# Patient Record
Sex: Female | Born: 1985 | Race: Asian | Hispanic: No | Marital: Married | Smoking: Never smoker
Health system: Southern US, Community
[De-identification: ages and names within clinical notes are randomized; demographics above are authoritative.]

## PROBLEM LIST (undated history)

## (undated) ENCOUNTER — Inpatient Hospital Stay (HOSPITAL_COMMUNITY): Payer: BLUE CROSS/BLUE SHIELD

## (undated) DIAGNOSIS — Z789 Other specified health status: Secondary | ICD-10-CM

## (undated) DIAGNOSIS — G5600 Carpal tunnel syndrome, unspecified upper limb: Secondary | ICD-10-CM

## (undated) HISTORY — PX: NO PAST SURGERIES: SHX2092

---

## 2005-04-22 ENCOUNTER — Other Ambulatory Visit: Admission: RE | Admit: 2005-04-22 | Discharge: 2005-04-22 | Payer: Self-pay | Admitting: Internal Medicine

## 2006-04-28 ENCOUNTER — Other Ambulatory Visit: Admission: RE | Admit: 2006-04-28 | Discharge: 2006-04-28 | Payer: Self-pay | Admitting: Internal Medicine

## 2007-06-18 ENCOUNTER — Other Ambulatory Visit: Admission: RE | Admit: 2007-06-18 | Discharge: 2007-06-18 | Payer: Self-pay | Admitting: Internal Medicine

## 2008-07-19 ENCOUNTER — Other Ambulatory Visit: Admission: RE | Admit: 2008-07-19 | Discharge: 2008-07-19 | Payer: Self-pay | Admitting: Internal Medicine

## 2008-08-08 ENCOUNTER — Emergency Department (HOSPITAL_COMMUNITY): Admission: EM | Admit: 2008-08-08 | Discharge: 2008-08-08 | Payer: Self-pay | Admitting: Emergency Medicine

## 2008-08-10 ENCOUNTER — Encounter: Admission: RE | Admit: 2008-08-10 | Discharge: 2008-08-10 | Payer: Self-pay | Admitting: Internal Medicine

## 2009-10-09 ENCOUNTER — Other Ambulatory Visit: Admission: RE | Admit: 2009-10-09 | Discharge: 2009-10-09 | Payer: Self-pay | Admitting: Internal Medicine

## 2010-05-20 LAB — DIFFERENTIAL
Basophils Relative: 0 % (ref 0–1)
Eosinophils Absolute: 0.1 10*3/uL (ref 0.0–0.7)
Eosinophils Relative: 1 % (ref 0–5)
Monocytes Relative: 6 % (ref 3–12)
Neutrophils Relative %: 78 % — ABNORMAL HIGH (ref 43–77)

## 2010-05-20 LAB — URINE CULTURE: Colony Count: 85000

## 2010-05-20 LAB — CBC
Hemoglobin: 13.2 g/dL (ref 12.0–15.0)
RBC: 4.62 MIL/uL (ref 3.87–5.11)
WBC: 16.7 10*3/uL — ABNORMAL HIGH (ref 4.0–10.5)

## 2010-05-20 LAB — URINALYSIS, ROUTINE W REFLEX MICROSCOPIC
Bilirubin Urine: NEGATIVE
Glucose, UA: NEGATIVE mg/dL
Ketones, ur: 15 mg/dL — AB
Leukocytes, UA: NEGATIVE
Protein, ur: 30 mg/dL — AB

## 2010-05-20 LAB — COMPREHENSIVE METABOLIC PANEL
ALT: 17 U/L (ref 0–35)
AST: 29 U/L (ref 0–37)
Alkaline Phosphatase: 53 U/L (ref 39–117)
CO2: 26 mEq/L (ref 19–32)
GFR calc non Af Amer: >60 mL/min (ref >60)
Glucose, Bld: 137 mg/dL — ABNORMAL HIGH (ref 70–99)
Potassium: 3.3 mEq/L — ABNORMAL LOW (ref 3.5–5.1)
Sodium: 137 mEq/L (ref 135–145)

## 2010-05-20 LAB — URINE MICROSCOPIC-ADD ON

## 2010-05-20 LAB — LIPASE, BLOOD: Lipase: 17 U/L (ref 11–59)

## 2010-11-25 ENCOUNTER — Other Ambulatory Visit (HOSPITAL_COMMUNITY)
Admission: RE | Admit: 2010-11-25 | Discharge: 2010-11-25 | Disposition: A | Discharge status: Home / Self Care | Payer: BC Managed Care – PPO | Source: Ambulatory Visit | Attending: Internal Medicine | Admitting: Internal Medicine

## 2010-11-25 DIAGNOSIS — Z01419 Encounter for gynecological examination (general) (routine) without abnormal findings: Secondary | ICD-10-CM | POA: Insufficient documentation

## 2011-02-17 ENCOUNTER — Other Ambulatory Visit (HOSPITAL_COMMUNITY)
Admission: RE | Admit: 2011-02-17 | Discharge: 2011-02-17 | Disposition: A | Discharge status: Home / Self Care | Payer: BC Managed Care – PPO | Source: Ambulatory Visit | Attending: Internal Medicine | Admitting: Internal Medicine

## 2011-02-17 DIAGNOSIS — Z01419 Encounter for gynecological examination (general) (routine) without abnormal findings: Secondary | ICD-10-CM | POA: Insufficient documentation

## 2011-10-17 ENCOUNTER — Emergency Department (INDEPENDENT_AMBULATORY_CARE_PROVIDER_SITE_OTHER)
Admission: EM | Admit: 2011-10-17 | Discharge: 2011-10-17 | Disposition: A | Discharge status: Home / Self Care | Payer: BC Managed Care – PPO | Source: Home / Self Care | Attending: Emergency Medicine | Admitting: Emergency Medicine

## 2011-10-17 ENCOUNTER — Encounter (HOSPITAL_COMMUNITY): Payer: Self-pay | Admitting: Emergency Medicine

## 2011-10-17 DIAGNOSIS — S46019A Strain of muscle(s) and tendon(s) of the rotator cuff of unspecified shoulder, initial encounter: Secondary | ICD-10-CM

## 2011-10-17 DIAGNOSIS — S139XXA Sprain of joints and ligaments of unspecified parts of neck, initial encounter: Secondary | ICD-10-CM

## 2011-10-17 DIAGNOSIS — S161XXA Strain of muscle, fascia and tendon at neck level, initial encounter: Secondary | ICD-10-CM

## 2011-10-17 MED ORDER — TRAMADOL HCL 50 MG PO TABS: 100.0000 mg | ORAL_TABLET | Freq: Three times a day (TID) | ORAL | Status: AC | PRN

## 2011-10-17 MED ORDER — METHOCARBAMOL 500 MG PO TABS: 500.0000 mg | ORAL_TABLET | Freq: Three times a day (TID) | ORAL | Status: AC

## 2011-10-17 MED ORDER — MELOXICAM 15 MG PO TABS: 15.0000 mg | ORAL_TABLET | Freq: Every day | ORAL | Status: DC
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
## 2011-12-31 LAB — OB RESULTS CONSOLE ABO/RH

## 2011-12-31 LAB — OB RESULTS CONSOLE RUBELLA ANTIBODY, IGM: Rubella: IMMUNE

## 2011-12-31 LAB — OB RESULTS CONSOLE HIV ANTIBODY (ROUTINE TESTING): HIV: NONREACTIVE

## 2012-01-06 LAB — OB RESULTS CONSOLE GC/CHLAMYDIA
Chlamydia: NEGATIVE
Gonorrhea: NEGATIVE

## 2012-02-11 NOTE — L&D Delivery Note (Signed)
Delivery Note At 11:26 PM a viable and healthy female was delivered via Vaginal, Spontaneous Delivery (Presentation:ROA ;  ).  APGAR: 8, 9; weight . 6lb 2 oz Placenta status: , Spontaneous. Intact to Path Pathology.  Cord: 3 vessels with the following complications: None.  Cord pH: none  Anesthesia: Epidural  Episiotomy:none  Lacerations: none Suture Repair: none Est. Blood Loss (mL): 250cc  Mom to postpartum.  Baby to nursery-stable.  Singleton Cristina Smith A 07/24/2012, 11:41 PM

## 2012-05-14 LAB — OB RESULTS CONSOLE RPR: RPR: NONREACTIVE

## 2012-07-16 LAB — OB RESULTS CONSOLE GBS: GBS: NEGATIVE

## 2012-07-24 ENCOUNTER — Inpatient Hospital Stay (HOSPITAL_COMMUNITY): Payer: BC Managed Care – PPO | Admitting: Anesthesiology

## 2012-07-24 ENCOUNTER — Inpatient Hospital Stay (HOSPITAL_COMMUNITY)
Admission: AD | Admit: 2012-07-24 | Discharge: 2012-07-26 | DRG: 372 | Disposition: A | Discharge status: Home / Self Care | Payer: BC Managed Care – PPO | Source: Ambulatory Visit | Attending: Obstetrics and Gynecology | Admitting: Obstetrics and Gynecology

## 2012-07-24 ENCOUNTER — Encounter (HOSPITAL_COMMUNITY): Payer: Self-pay | Admitting: Anesthesiology

## 2012-07-24 ENCOUNTER — Encounter (HOSPITAL_COMMUNITY): Payer: Self-pay | Admitting: *Deleted

## 2012-07-24 DIAGNOSIS — O139 Gestational [pregnancy-induced] hypertension without significant proteinuria, unspecified trimester: Secondary | ICD-10-CM | POA: Diagnosis present

## 2012-07-24 DIAGNOSIS — O4290 Premature rupture of membranes, unspecified as to length of time between rupture and onset of labor, unspecified weeks of gestation: Principal | ICD-10-CM | POA: Diagnosis present

## 2012-07-24 DIAGNOSIS — O133 Gestational [pregnancy-induced] hypertension without significant proteinuria, third trimester: Secondary | ICD-10-CM

## 2012-07-24 HISTORY — DX: Other specified health status: Z78.9

## 2012-07-24 LAB — ABO/RH: ABO/RH(D): O POS

## 2012-07-24 LAB — CBC
HCT: 38.1 % (ref 36.0–46.0)
Hemoglobin: 12.4 g/dL (ref 12.0–15.0)
MCH: 27 pg (ref 26.0–34.0)
MCHC: 33.3 g/dL (ref 30.0–36.0)
MCV: 81 fL (ref 78.0–100.0)
Platelets: 243 10*3/uL (ref 150–400)
RBC: 4.67 MIL/uL (ref 3.87–5.11)
RBC: 4.59 MIL/uL (ref 3.87–5.11)
WBC: 10.5 10*3/uL (ref 4.0–10.5)

## 2012-07-24 LAB — TYPE AND SCREEN
ABO/RH(D): O POS
Antibody Screen: NEGATIVE

## 2012-07-24 LAB — RPR: RPR Ser Ql: NONREACTIVE

## 2012-07-24 MED ORDER — ONDANSETRON HCL 4 MG/2ML IJ SOLN: 4.0000 mg | Freq: Four times a day (QID) | INTRAMUSCULAR | Status: DC | PRN

## 2012-07-24 MED ORDER — LACTATED RINGERS IV SOLN: 500.0000 mL | INTRAVENOUS | Status: DC | PRN

## 2012-07-24 MED ORDER — LACTATED RINGERS IV SOLN
INTRAVENOUS | Status: DC
  Administered 2012-07-24: 19:00:00 via INTRAVENOUS
  Administered 2012-07-24: 125 mL/h via INTRAVENOUS

## 2012-07-24 MED ORDER — FLEET ENEMA 7-19 GM/118ML RE ENEM: 1.0000 | ENEMA | RECTAL | Status: DC | PRN

## 2012-07-24 MED ORDER — OXYTOCIN 40 UNITS IN LACTATED RINGERS INFUSION - SIMPLE MED
1.0000 m[IU]/min | INTRAVENOUS | Status: DC
  Administered 2012-07-24: 2 m[IU]/min via INTRAVENOUS
  Administered 2012-07-24: 4 m[IU]/min via INTRAVENOUS
  Filled 2012-07-24: qty 1000

## 2012-07-24 MED ORDER — OXYTOCIN BOLUS FROM INFUSION
500.0000 mL | INTRAVENOUS | Status: DC
  Administered 2012-07-24: 500 mL via INTRAVENOUS

## 2012-07-24 MED ORDER — OXYCODONE-ACETAMINOPHEN 5-325 MG PO TABS: 1.0000 | ORAL_TABLET | ORAL | Status: DC | PRN

## 2012-07-24 MED ORDER — LIDOCAINE HCL (PF) 1 % IJ SOLN
INTRAMUSCULAR | Status: DC | PRN
  Administered 2012-07-24 (×2): 4 mL
  Administered 2012-07-24: 2 mL
  Administered 2012-07-24: 4 mL

## 2012-07-24 MED ORDER — LACTATED RINGERS IV SOLN
500.0000 mL | Freq: Once | INTRAVENOUS | Status: AC
  Administered 2012-07-24: 500 mL via INTRAVENOUS

## 2012-07-24 MED ORDER — CITRIC ACID-SODIUM CITRATE 334-500 MG/5ML PO SOLN: 30.0000 mL | ORAL | Status: DC | PRN

## 2012-07-24 MED ORDER — OXYTOCIN 40 UNITS IN LACTATED RINGERS INFUSION - SIMPLE MED: 62.5000 mL/h | INTRAVENOUS | Status: DC

## 2012-07-24 MED ORDER — TERBUTALINE SULFATE 1 MG/ML IJ SOLN: 0.2500 mg | Freq: Once | INTRAMUSCULAR | Status: AC | PRN

## 2012-07-24 MED ORDER — LIDOCAINE HCL (PF) 1 % IJ SOLN
30.0000 mL | INTRAMUSCULAR | Status: DC | PRN
  Filled 2012-07-24: qty 30

## 2012-07-24 MED ORDER — DIPHENHYDRAMINE HCL 50 MG/ML IJ SOLN: 12.5000 mg | INTRAMUSCULAR | Status: DC | PRN

## 2012-07-24 MED ORDER — EPHEDRINE 5 MG/ML INJ
10.0000 mg | INTRAVENOUS | Status: DC | PRN
  Filled 2012-07-24: qty 2

## 2012-07-24 MED ORDER — BUPIVACAINE HCL (PF) 0.25 % IJ SOLN
INTRAMUSCULAR | Status: DC | PRN
  Administered 2012-07-24: 5 mL via EPIDURAL

## 2012-07-24 MED ORDER — PHENYLEPHRINE 40 MCG/ML (10ML) SYRINGE FOR IV PUSH (FOR BLOOD PRESSURE SUPPORT)
80.0000 ug | PREFILLED_SYRINGE | INTRAVENOUS | Status: DC | PRN
  Filled 2012-07-24: qty 5
  Filled 2012-07-24: qty 2

## 2012-07-24 MED ORDER — EPHEDRINE 5 MG/ML INJ
10.0000 mg | INTRAVENOUS | Status: DC | PRN
  Filled 2012-07-24: qty 2
  Filled 2012-07-24: qty 4

## 2012-07-24 MED ORDER — PHENYLEPHRINE 40 MCG/ML (10ML) SYRINGE FOR IV PUSH (FOR BLOOD PRESSURE SUPPORT)
80.0000 ug | PREFILLED_SYRINGE | INTRAVENOUS | Status: DC | PRN
  Filled 2012-07-24: qty 2

## 2012-07-24 MED ORDER — NALBUPHINE SYRINGE 5 MG/0.5 ML
10.0000 mg | INJECTION | INTRAMUSCULAR | Status: DC | PRN
  Administered 2012-07-24: 10 mg via INTRAVENOUS
  Filled 2012-07-24 (×2): qty 1

## 2012-07-24 MED ORDER — FENTANYL 2.5 MCG/ML BUPIVACAINE 1/10 % EPIDURAL INFUSION (WH - ANES)
14.0000 mL/h | INTRAMUSCULAR | Status: DC | PRN
  Administered 2012-07-24: 12 mL/h via EPIDURAL
  Filled 2012-07-24: qty 125

## 2012-07-24 MED ORDER — ACETAMINOPHEN 325 MG PO TABS
650.0000 mg | ORAL_TABLET | ORAL | Status: DC | PRN
  Administered 2012-07-24: 650 mg via ORAL
  Filled 2012-07-24: qty 2

## 2012-07-24 MED ORDER — IBUPROFEN 600 MG PO TABS: 600.0000 mg | ORAL_TABLET | Freq: Four times a day (QID) | ORAL | Status: DC | PRN

## 2012-07-24 NOTE — H&P (Signed)
Cristina Smith is a 27 y.o. female presenting for admission due to SROM clear fluid @ 36 2/7 weeks. (-) GBS. Pt had normal PIH labs done in office yesterday. Denies PIH warning signs (+) LE edema Maternal Medical History:  Reason for admission: Rupture of membranes.   Contractions: Frequency: irregular.    Fetal activity: Perceived fetal activity is normal.    Prenatal complications: no prenatal complications Prenatal Complications - Diabetes: none.    OB History   Grav Para Term Preterm Abortions TAB SAB Ect Mult Living   1              Past Medical History  Diagnosis Date  . Medical history non-contributory    Past Surgical History  Procedure Laterality Date  . No past surgeries     Family History: family history includes Cancer in her father and Hypertension in her mother. Social History:  reports that she has never smoked. She has never used smokeless tobacco. She reports that she does not drink alcohol or use illicit drugs.   Prenatal Transfer Tool  Maternal Diabetes: No Genetic Screening: Normal Maternal Ultrasounds/Referrals: Normal Fetal Ultrasounds or other Referrals:  None Maternal Substance Abuse:  No Significant Maternal Medications:  None Significant Maternal Lab Results:  Lab values include: Group B Strep negative Other Comments:  None  Review of Systems  Eyes: Negative for blurred vision.  Gastrointestinal: Negative for heartburn.  Neurological: Negative for headaches.    Dilation: 2 Effacement (%): 80 Station: -2;-1 Exam by:: K.WIlson,RN Blood pressure 134/94, pulse 94, temperature 98.4 F (36.9 C), temperature source Oral, resp. rate 20, height 5' (1.524 m), weight 85.276 kg (188 lb), last menstrual period 11/13/2011. Exam Physical Exam  Constitutional: She is oriented to person, place, and time. She appears well-developed and well-nourished.  Eyes: EOM are normal.  Neck: Neck supple.  Cardiovascular: Normal rate and regular rhythm.    Respiratory: Breath sounds normal.  GI: Soft.  Musculoskeletal: She exhibits edema.  2(+)  Neurological: She is alert and oriented to person, place, and time.  Skin: Skin is warm and dry.  Psychiatric: She has a normal mood and affect.   VE 1/60/-3 vtx Prenatal labs: ABO, Rh: O/Positive/-- (11/20 0000) Antibody: Negative (11/20 0000) Rubella: Immune (11/20 0000) RPR: Nonreactive (04/04 0000)  HBsAg: Negative (11/20 0000)  HIV: Non-reactive (11/20 0000)  GBS:   negative  Assessment/Plan: PPROM @ 36 2/7 weeks P) admit routine labs pitocin augmentation, watch BP. Analgesic prn  Erza Mothershead A 07/24/2012, 2:09 PM

## 2012-07-24 NOTE — MAU Note (Signed)
Pt presents with contractions that started last night and states they are getting closer. She noticed a large gush of fluid with some vaginal bleeding at 1130. States baby is active.

## 2012-07-24 NOTE — Progress Notes (Signed)
Up to BR to void.  Tolerated activity well. 

## 2012-07-24 NOTE — Anesthesia Preprocedure Evaluation (Signed)

## 2012-07-24 NOTE — Progress Notes (Signed)
Cristina Smith is a 28 y.o. G1P0 at [redacted]w[redacted]d by ultrasound admitted for rupture of membranes  Subjective: Chief Complaint  Patient presents with  . Contractions  . possible rupture of membranes   c/o painful ctx  Pitocin 6 MIU Objective: BP 133/89  Pulse 99  Temp(Src) 97.9 F (36.6 C) (Oral)  Resp 22  Ht 5' (1.524 m)  Wt 85.276 kg (188 lb)  BMI 36.72 kg/m2  LMP 11/13/2011      FHT:  FHR: 140 bpm, variability: moderate,  accelerations:  Present,  decelerations:  Absent UC:   irregular, every 2-13minutes SVE:   3/100/-1 per RN  Labs: Lab Results  Component Value Date   WBC 10.5 07/24/2012   HGB 12.4 07/24/2012   HCT 38.1 07/24/2012   MCV 81.6 07/24/2012   PLT 275 07/24/2012    Assessment / Plan: Induction of labor due to PROM,  progressing well on pitocin P) epidural prn. Cont pitocin  Anticipated MOD:  NSVD  Lathon Adan A 07/24/2012, 7:09 PM

## 2012-07-24 NOTE — Anesthesia Procedure Notes (Signed)
Epidural Patient location during procedure: OB Start time: 07/24/2012 7:41 PM  Staffing Performed by: anesthesiologist   Preanesthetic Checklist Completed: patient identified, site marked, surgical consent, pre-op evaluation, timeout performed, IV checked, risks and benefits discussed and monitors and equipment checked  Epidural Patient position: sitting Prep: site prepped and draped and DuraPrep Patient monitoring: continuous pulse ox and blood pressure Approach: midline Injection technique: LOR air  Needle:  Needle type: Tuohy  Needle gauge: 17 G Needle length: 9 cm and 9 Needle insertion depth: 5.5 cm Catheter type: closed end flexible Catheter size: 19 Gauge Catheter at skin depth: 10.5 cm Test dose: negative  Assessment Events: blood not aspirated, injection not painful, no injection resistance, negative IV test and no paresthesia  Additional Notes Discussed risk of headache, infection, bleeding, nerve injury and failed or incomplete block.  Patient voices understanding and wishes to proceed.  Epidural placed easily on first attempt.  No paresthesia.  Patient tolerated procedure well with no apparent complications.  Jasmine December, MD Reason for block:procedure for pain

## 2012-07-24 NOTE — Progress Notes (Signed)
Dr. Cherly Hensen reports GBS neg.

## 2012-07-24 NOTE — Progress Notes (Signed)
S: no complaint. Pushing well  O: Pitocin Epidural  VE: fully dilated (+2 station  Tracing; cat 1 Ctx q  IMP: PPROM GBS cx neg P) anticipate SVD

## 2012-07-25 ENCOUNTER — Encounter (HOSPITAL_COMMUNITY): Payer: Self-pay | Admitting: *Deleted

## 2012-07-25 DIAGNOSIS — O139 Gestational [pregnancy-induced] hypertension without significant proteinuria, unspecified trimester: Secondary | ICD-10-CM | POA: Diagnosis present

## 2012-07-25 DIAGNOSIS — O4290 Premature rupture of membranes, unspecified as to length of time between rupture and onset of labor, unspecified weeks of gestation: Secondary | ICD-10-CM | POA: Diagnosis present

## 2012-07-25 LAB — CBC
Platelets: 221 10*3/uL (ref 150–400)
RBC: 4.31 MIL/uL (ref 3.87–5.11)
WBC: 22.5 10*3/uL — ABNORMAL HIGH (ref 4.0–10.5)

## 2012-07-25 LAB — COMPREHENSIVE METABOLIC PANEL
ALT: 21 U/L (ref 0–35)
AST: 26 U/L (ref 0–37)
Alkaline Phosphatase: 122 U/L — ABNORMAL HIGH (ref 39–117)
CO2: 21 mEq/L (ref 19–32)
Chloride: 101 mEq/L (ref 96–112)
GFR calc non Af Amer: >90 mL/min (ref >90)
Sodium: 133 mEq/L — ABNORMAL LOW (ref 135–145)
Total Bilirubin: 0.3 mg/dL (ref 0.3–1.2)

## 2012-07-25 MED ORDER — LANOLIN HYDROUS EX OINT: TOPICAL_OINTMENT | CUTANEOUS | Status: DC | PRN

## 2012-07-25 MED ORDER — OXYCODONE-ACETAMINOPHEN 5-325 MG PO TABS: 1.0000 | ORAL_TABLET | ORAL | Status: DC | PRN

## 2012-07-25 MED ORDER — HYDROCHLOROTHIAZIDE 12.5 MG PO CAPS
12.5000 mg | ORAL_CAPSULE | Freq: Every day | ORAL | Status: DC
  Administered 2012-07-25 – 2012-07-26 (×2): 12.5 mg via ORAL
  Filled 2012-07-25 (×3): qty 1

## 2012-07-25 MED ORDER — FERROUS SULFATE 325 (65 FE) MG PO TABS
325.0000 mg | ORAL_TABLET | Freq: Two times a day (BID) | ORAL | Status: DC
  Administered 2012-07-25 – 2012-07-26 (×3): 325 mg via ORAL
  Filled 2012-07-25 (×4): qty 1

## 2012-07-25 MED ORDER — POTASSIUM CHLORIDE 20 MEQ/15ML (10%) PO LIQD
40.0000 meq | Freq: Two times a day (BID) | ORAL | Status: DC
  Administered 2012-07-25 – 2012-07-26 (×4): 40 meq via ORAL
  Filled 2012-07-25 (×6): qty 30

## 2012-07-25 MED ORDER — DIPHENHYDRAMINE HCL 25 MG PO CAPS: 25.0000 mg | ORAL_CAPSULE | Freq: Four times a day (QID) | ORAL | Status: DC | PRN

## 2012-07-25 MED ORDER — POTASSIUM CHLORIDE 20 MEQ PO PACK
40.0000 meq | PACK | Freq: Two times a day (BID) | ORAL | Status: DC
  Filled 2012-07-25 (×2): qty 2

## 2012-07-25 MED ORDER — BENZOCAINE-MENTHOL 20-0.5 % EX AERO
1.0000 "application " | INHALATION_SPRAY | CUTANEOUS | Status: DC | PRN
  Administered 2012-07-25: 1 via TOPICAL
  Filled 2012-07-25: qty 56

## 2012-07-25 MED ORDER — SIMETHICONE 80 MG PO CHEW: 80.0000 mg | CHEWABLE_TABLET | ORAL | Status: DC | PRN

## 2012-07-25 MED ORDER — IBUPROFEN 600 MG PO TABS
600.0000 mg | ORAL_TABLET | Freq: Four times a day (QID) | ORAL | Status: DC
  Administered 2012-07-25 – 2012-07-26 (×7): 600 mg via ORAL
  Filled 2012-07-25 (×8): qty 1

## 2012-07-25 MED ORDER — LORATADINE 10 MG PO TABS
10.0000 mg | ORAL_TABLET | Freq: Every day | ORAL | Status: DC
  Administered 2012-07-25 – 2012-07-26 (×2): 10 mg via ORAL
  Filled 2012-07-25 (×3): qty 1

## 2012-07-25 MED ORDER — WITCH HAZEL-GLYCERIN EX PADS: 1.0000 "application " | MEDICATED_PAD | CUTANEOUS | Status: DC | PRN

## 2012-07-25 MED ORDER — PRENATAL MULTIVITAMIN CH
1.0000 | ORAL_TABLET | Freq: Every day | ORAL | Status: DC
  Administered 2012-07-25 – 2012-07-26 (×2): 1 via ORAL
  Filled 2012-07-25: qty 1

## 2012-07-25 MED ORDER — TETANUS-DIPHTH-ACELL PERTUSSIS 5-2.5-18.5 LF-MCG/0.5 IM SUSP: 0.5000 mL | Freq: Once | INTRAMUSCULAR | Status: DC

## 2012-07-25 MED ORDER — DIBUCAINE 1 % RE OINT: 1.0000 "application " | TOPICAL_OINTMENT | RECTAL | Status: DC | PRN

## 2012-07-25 MED ORDER — ZOLPIDEM TARTRATE 5 MG PO TABS: 5.0000 mg | ORAL_TABLET | Freq: Every evening | ORAL | Status: DC | PRN

## 2012-07-25 MED ORDER — ONDANSETRON HCL 4 MG/2ML IJ SOLN: 4.0000 mg | INTRAMUSCULAR | Status: DC | PRN

## 2012-07-25 MED ORDER — ONDANSETRON HCL 4 MG PO TABS: 4.0000 mg | ORAL_TABLET | ORAL | Status: DC | PRN

## 2012-07-25 MED ORDER — SENNOSIDES-DOCUSATE SODIUM 8.6-50 MG PO TABS
2.0000 | ORAL_TABLET | Freq: Every day | ORAL | Status: DC
  Administered 2012-07-25: 2 via ORAL

## 2012-07-25 NOTE — Progress Notes (Signed)
Patient ID: Nakaiya Beddow, female   DOB: 1985-09-05, 27 y.o.   MRN: [id number] PPD # 1  Subjective: Pt reports feeling well.  Denies HA, visual changes, CP, SOB Pain controlled with ibuprofen Tolerating po/ Voiding without problems/ No n/v Bleeding is moderate Newborn info:  Information for the patient's newborn:  Morningstar, Toft [[id number]]  female  / circ not planned/ Feeding: breast   Objective:  VS: Blood pressure 121/85, pulse 93, temperature 98.1 F (36.7 C), temperature source Oral, resp. rate 18  Recent Labs  07/24/12 1855 07/25/12 0609  WBC 15.0* 22.5*  HGB 12.4 11.6*  HCT 37.2 34.8*  PLT 243 221    Blood type: O POS (06/14 1310) Rubella: Immune (11/20 0000)    Physical Exam:  General:  alert, cooperative and no distress CV: Regular rate and rhythm Resp: clear Abdomen: soft, nontender, normal bowel sounds Uterine Fundus: firm, below umbilicus, nontender Perineum: intact; mild edema Lochia: moderate Ext: edema +1 to 2 pedal/+1 pretib and Homans sign is negative, no sign of DVT   A/P: PPD # 1/ G1P0101/ S/P: SVD Hx gestational HTN with fluid retention; HCTZ started, watch BP's; Labs WNL this am Doing well Continue routine post partum orders Anticipate D/C home in AM    Demetrius Revel, MSN, [facility] 07/25/2012, 9:56 AM

## 2012-07-25 NOTE — Anesthesia Postprocedure Evaluation (Signed)
Anesthesia Post Note  Patient: Cristina Smith  Procedure(s) Performed: * No procedures listed *  Anesthesia type: Epidural  Patient location: Mother/Baby  Post pain: Pain level controlled  Post assessment: Post-op Vital signs reviewed  Last Vitals:  Filed Vitals:   07/25/12 0654  BP: 121/85  Pulse: 93  Temp: 36.7 C  Resp: 18    Post vital signs: Reviewed  Level of consciousness:alert  Complications: No apparent anesthesia complications

## 2012-07-26 LAB — CBC
HCT: 34.7 % — ABNORMAL LOW (ref 36.0–46.0)
Hemoglobin: 11.2 g/dL — ABNORMAL LOW (ref 12.0–15.0)
MCH: 26.7 pg (ref 26.0–34.0)
MCHC: 32.3 g/dL (ref 30.0–36.0)
MCV: 82.8 fL (ref 78.0–100.0)
Platelets: 242 10*3/uL (ref 150–400)
RBC: 4.19 MIL/uL (ref 3.87–5.11)
RDW: 16.7 % — ABNORMAL HIGH (ref 11.5–15.5)
WBC: 18.8 10*3/uL — ABNORMAL HIGH (ref 4.0–10.5)

## 2012-07-26 MED ORDER — IBUPROFEN 600 MG PO TABS: 600.0000 mg | ORAL_TABLET | Freq: Four times a day (QID) | ORAL | Status: DC | PRN

## 2012-07-26 MED ORDER — DOCUSATE SODIUM 100 MG PO CAPS: 100.0000 mg | ORAL_CAPSULE | Freq: Two times a day (BID) | ORAL | Status: DC | PRN

## 2012-07-26 MED ORDER — OXYCODONE-ACETAMINOPHEN 5-325 MG PO TABS: 1.0000 | ORAL_TABLET | Freq: Four times a day (QID) | ORAL | Status: DC | PRN

## 2012-07-26 MED ORDER — HYDROCHLOROTHIAZIDE 12.5 MG PO CAPS: 12.5000 mg | ORAL_CAPSULE | Freq: Every day | ORAL | Status: DC

## 2012-07-26 NOTE — Discharge Summary (Addendum)
Obstetric Discharge Summary 27 year old G1P0 admitted for SROM at [redacted]w[redacted]d.  Reason for Admission: onset of labor and rupture of membranes Prenatal Procedures: none Intrapartum Procedures: spontaneous vaginal delivery Postpartum Procedures: none Complications-Operative and Postpartum: none Hemoglobin  Date Value Range Status  07/25/2012 11.6* 12.0 - 15.0 g/dL Final     HCT  Date Value Range Status  07/25/2012 34.8* 36.0 - 46.0 % Final    Physical Exam:  General: alert, cooperative and no distress Lochia: appropriate Uterine Fundus: firm DVT Evaluation: No evidence of DVT seen on physical exam. Negative Homan's sign.  Discharge Diagnoses: G1 P1 S/P PTD @ 36wks  Discharge Information: Date: 07/26/2012 Activity: pelvic rest Diet: routine Medications: Ibuprofen and Colace and Microzide 12.5 mg Condition: stable Instructions: refer to practice specific booklet Discharge to: home Follow-up Information   Follow up with MODY,VAISHALI R, MD In 6 weeks.   Contact information:   [address] [place] [address] [phone]       Newborn Data: Live born female  Birth Weight: 6 lb 2.2 oz (2785 g) APGAR: 8, 9  Home with mother.  Leward Quan, RN, FNP Student 07/26/2012, 9:48 AM  Reviewed by: Arlana Lindau, MSN, [facility] 07/26/12 10:02

## 2012-07-26 NOTE — Progress Notes (Addendum)
PPD # 2 S/P SVD with no repair. Subjective: Pt reports feeling well but fatigued after little sleep last night/ Pain controlled with prescription NSAID's including motrin Tolerating po/ Voiding without problems/ No n/v Bleeding is light/ Newborn info:  Information for the patient's newborn:  Mardie, Kellen [[id number]]  female circ not planned/ Feeding: breast and bottle    Objective:  VS: Blood pressure 126/82, pulse 89, temperature 98.5 F (36.9 C), temperature source Oral, resp. rate 18, height 5' (1.524 m), weight 188 lb (85.276 kg), last menstrual period 10/25/2011, unknown if currently breastfeeding.    Recent Labs  07/24/12 1855 07/25/12 0609  WBC 15.0* 22.5*  HGB 12.4 11.6*  HCT 37.2 34.8*  PLT 243 221   Mild leukocytosis. No fevers.  Blood type: --/--/O POS, O POS (06/14 1310) Rubella: Immune (11/20 0000)    Physical Exam:  General: A & O x 3  alert, cooperative and no distress CV: Regular rate and rhythm Resp: clear Abdomen: soft, nontender, normal bowel sounds Uterine Fundus: firm, below umbilicus, nontender Lochia: minimal Ext: edema 2+ pedal and Homans sign is negative, no sign of DVT    A/P: PPD # 2/ G1P0101/ S/P:spontaneous vaginal Hx Gest HTN with fluid retention since delivery; stable and will cont HCTZ to d 5 pp Mild leukocytosis- will recheck CBC this AM and make sure is trending down. Afebrile. Doing well and stable for discharge home RX: Ibuprofen 600mg  po Q 6 hrs prn pain #30 Refill x 1 HCTZ 12.5mg  po QD x 3 Percocet 5/325 1 to 2 po Q 4 hrs prn pain #15 No refill Colace 100 mg TID PRN #30, Refill x1 WOB/GYN booklet given Routine pp visit in 6wks   TEDDER, Elisha Headland, RN, BSN, FNP Student  07/26/2012, 9:42 AM

## 2013-08-01 ENCOUNTER — Emergency Department (HOSPITAL_COMMUNITY)
Admission: EM | Admit: 2013-08-01 | Discharge: 2013-08-01 | Disposition: A | Discharge status: Home / Self Care | Payer: BC Managed Care – PPO | Attending: Emergency Medicine | Admitting: Emergency Medicine

## 2013-08-01 ENCOUNTER — Encounter (HOSPITAL_COMMUNITY): Payer: Self-pay | Admitting: Emergency Medicine

## 2013-08-01 ENCOUNTER — Emergency Department (HOSPITAL_COMMUNITY): Payer: BC Managed Care – PPO

## 2013-08-01 DIAGNOSIS — R1013 Epigastric pain: Secondary | ICD-10-CM

## 2013-08-01 DIAGNOSIS — Z3202 Encounter for pregnancy test, result negative: Secondary | ICD-10-CM | POA: Insufficient documentation

## 2013-08-01 DIAGNOSIS — R112 Nausea with vomiting, unspecified: Secondary | ICD-10-CM

## 2013-08-01 DIAGNOSIS — Z791 Long term (current) use of non-steroidal anti-inflammatories (NSAID): Secondary | ICD-10-CM | POA: Insufficient documentation

## 2013-08-01 DIAGNOSIS — Z79899 Other long term (current) drug therapy: Secondary | ICD-10-CM | POA: Insufficient documentation

## 2013-08-01 LAB — URINALYSIS, ROUTINE W REFLEX MICROSCOPIC
BILIRUBIN URINE: NEGATIVE
GLUCOSE, UA: NEGATIVE mg/dL
KETONES UR: NEGATIVE mg/dL
Nitrite: NEGATIVE
PH: 7.5 (ref 5.0–8.0)
PROTEIN: 100 mg/dL — AB
Specific Gravity, Urine: 1.026 (ref 1.005–1.030)
Urobilinogen, UA: 0.2 mg/dL (ref 0.0–1.0)

## 2013-08-01 LAB — CBC WITH DIFFERENTIAL/PLATELET
BASOS ABS: 0 10*3/uL (ref 0.0–0.1)
Basophils Relative: 0 % (ref 0–1)
EOS PCT: 0 % (ref 0–5)
Eosinophils Absolute: 0 10*3/uL (ref 0.0–0.7)
HEMATOCRIT: 37.8 % (ref 36.0–46.0)
Hemoglobin: 12.1 g/dL (ref 12.0–15.0)
LYMPHS ABS: 1.6 10*3/uL (ref 0.7–4.0)
LYMPHS PCT: 17 % (ref 12–46)
MCH: 26.1 pg (ref 26.0–34.0)
MCHC: 32 g/dL (ref 30.0–36.0)
MCV: 81.6 fL (ref 78.0–100.0)
MONO ABS: 0.3 10*3/uL (ref 0.1–1.0)
Monocytes Relative: 3 % (ref 3–12)
NEUTROS ABS: 7.3 10*3/uL (ref 1.7–7.7)
Neutrophils Relative %: 80 % — ABNORMAL HIGH (ref 43–77)
Platelets: 337 10*3/uL (ref 150–400)
RBC: 4.63 MIL/uL (ref 3.87–5.11)
RDW: 13 % (ref 11.5–15.5)
WBC: 9.2 10*3/uL (ref 4.0–10.5)

## 2013-08-01 LAB — COMPREHENSIVE METABOLIC PANEL
ALT: 29 U/L (ref 0–35)
AST: 32 U/L (ref 0–37)
Albumin: 4.5 g/dL (ref 3.5–5.2)
Alkaline Phosphatase: 73 U/L (ref 39–117)
BILIRUBIN TOTAL: 0.5 mg/dL (ref 0.3–1.2)
BUN: 12 mg/dL (ref 6–23)
CHLORIDE: 100 meq/L (ref 96–112)
CO2: 20 meq/L (ref 19–32)
Calcium: 9.7 mg/dL (ref 8.4–10.5)
Creatinine, Ser: 0.7 mg/dL (ref 0.50–1.10)
GLUCOSE: 115 mg/dL — AB (ref 70–99)
Potassium: 4.2 mEq/L (ref 3.7–5.3)
SODIUM: 136 meq/L — AB (ref 137–147)
Total Protein: 8.4 g/dL — ABNORMAL HIGH (ref 6.0–8.3)

## 2013-08-01 LAB — URINE MICROSCOPIC-ADD ON

## 2013-08-01 LAB — PREGNANCY, URINE: Preg Test, Ur: NEGATIVE

## 2013-08-01 LAB — LIPASE, BLOOD: Lipase: 24 U/L (ref 11–59)

## 2013-08-01 MED ORDER — FAMOTIDINE 20 MG PO TABS
40.0000 mg | ORAL_TABLET | Freq: Once | ORAL | Status: DC
  Filled 2013-08-01: qty 2

## 2013-08-01 MED ORDER — ONDANSETRON 4 MG PO TBDP: 4.0000 mg | ORAL_TABLET | Freq: Three times a day (TID) | ORAL | Status: DC | PRN

## 2013-08-01 MED ORDER — PROMETHAZINE HCL 25 MG/ML IJ SOLN
12.5000 mg | Freq: Once | INTRAMUSCULAR | Status: AC
  Administered 2013-08-01: 12.5 mg via INTRAVENOUS
  Filled 2013-08-01: qty 1

## 2013-08-01 MED ORDER — ACETAMINOPHEN 325 MG PO TABS
650.0000 mg | ORAL_TABLET | Freq: Once | ORAL | Status: AC
  Administered 2013-08-01: 650 mg via ORAL
  Filled 2013-08-01: qty 2

## 2013-08-01 MED ORDER — GI COCKTAIL ~~LOC~~
30.0000 mL | Freq: Once | ORAL | Status: DC
  Filled 2013-08-01: qty 30

## 2013-08-01 MED ORDER — MORPHINE SULFATE 2 MG/ML IJ SOLN
2.0000 mg | INTRAMUSCULAR | Status: DC | PRN
  Administered 2013-08-01: 2 mg via INTRAVENOUS
  Filled 2013-08-01: qty 1

## 2013-08-01 MED ORDER — SODIUM CHLORIDE 0.9 % IV SOLN: INTRAVENOUS | Status: DC

## 2013-08-01 MED ORDER — ONDANSETRON 4 MG PO TBDP
8.0000 mg | ORAL_TABLET | Freq: Once | ORAL | Status: AC
  Administered 2013-08-01: 8 mg via ORAL
  Filled 2013-08-01: qty 2

## 2013-08-01 MED ORDER — FAMOTIDINE IN NACL 20-0.9 MG/50ML-% IV SOLN
20.0000 mg | Freq: Once | INTRAVENOUS | Status: AC
  Administered 2013-08-01: 20 mg via INTRAVENOUS
  Filled 2013-08-01: qty 50

## 2013-08-01 MED ORDER — SODIUM CHLORIDE 0.9 % IV BOLUS (SEPSIS)
1000.0000 mL | Freq: Once | INTRAVENOUS | Status: AC
  Administered 2013-08-01: 1000 mL via INTRAVENOUS

## 2013-08-01 NOTE — ED Notes (Addendum)
Called into room by pt sister, pt feeling numb in her face, fingers tingling.  Pt resp rate >40, O2 sat 100% on room air.  Pt had been cautioned re: hyperventilation.  Pt was not responsive to cautions and request to lower resp rate. Mask placed over face to assist with utilizing excess oxygen.  Dr Clarene DukeMcManus into room to see pt.

## 2013-08-01 NOTE — ED Notes (Addendum)
Pt ate spicy food 6/21 at noon.  Began c/o epigastric pain and vomiting around 1800.  Pain radiates to back from epigastric area.  Appears to be hyperventillating.

## 2013-08-01 NOTE — ED Provider Notes (Signed)
CSN: [id number]     Arrival date & time 08/01/13  0149 History   First MD Initiated Contact with Patient 08/01/13 0303     Chief Complaint  Patient presents with  . Emesis     HPI Pt was seen at 0315.  Per pt, c/o gradual onset and persistence of constant upper abd "pain" since yesterday afternoon. Pt's discomfort began shortly after she "ate spicy food at lunch."  Has been associated with multiple intermittent episodes of N/V.  Describes the abd pain as "burning," and "cramping."  Denies diarrhea, no fevers, no back pain, no rash, no CP/SOB, no black or blood in stools or emesis.       Past Medical History  Diagnosis Date  . Medical history non-contributory   . SVD (spontaneous vaginal delivery) 07/25/2012   Past Surgical History  Procedure Laterality Date  . No past surgeries     Family History  Problem Relation Age of Onset  . Hypertension Mother   . Cancer Father    History  Substance Use Topics  . Smoking status: Never Smoker   . Smokeless tobacco: Never Used  . Alcohol Use: No   OB History   Grav Para Term Preterm Abortions TAB SAB Ect Mult Living   1 1  1      1      Review of Systems ROS: Statement: All systems negative except as marked or noted in the HPI; Constitutional: Negative for fever and chills. ; ; Eyes: Negative for eye pain, redness and discharge. ; ; ENMT: Negative for ear pain, hoarseness, nasal congestion, sinus pressure and sore throat. ; ; Cardiovascular: Negative for chest pain, palpitations, diaphoresis, dyspnea and peripheral edema. ; ; Respiratory: Negative for cough, wheezing and stridor. ; ; Gastrointestinal: +N/V, abd pain. Negative for diarrhea, blood in stool, hematemesis, jaundice and rectal bleeding. . ; ; Genitourinary: Negative for dysuria, flank pain and hematuria. ; ; Musculoskeletal: Negative for back pain and neck pain. Negative for swelling and trauma.; ; Skin: Negative for pruritus, rash, abrasions, blisters, bruising and skin lesion.; ;  Neuro: Negative for headache, lightheadedness and neck stiffness. Negative for weakness, altered level of consciousness , altered mental status, extremity weakness, paresthesias, involuntary movement, seizure and syncope.      Allergies  Review of patient's allergies indicates no known allergies.  Home Medications   Prior to Admission medications   Medication Sig Start Date End Date Taking? Authorizing Provider  fexofenadine (ALLEGRA) 180 MG tablet Take 180 mg by mouth daily.   Yes Historical Provider, MD  ibuprofen (ADVIL,MOTRIN) 200 MG tablet Take 400 mg by mouth every 6 (six) hours as needed for mild pain.   Yes Historical Provider, MD  Levonorgestrel (MIRENA IU) 1 Device by Intrauterine route once.   Yes Historical Provider, MD   BP 127/94  Pulse 78  Temp(Src) 97.9 F (36.6 C) (Oral)  Resp 10  Ht 5' (1.524 m)  Wt 164 lb (74.39 kg)  BMI 32.03 kg/m2  SpO2 100%  Breastfeeding? No Physical Exam 0320: Physical examination:  Nursing notes reviewed; Vital signs and O2 SAT reviewed;  Constitutional: Well developed, Well nourished, Well hydrated, Anxious; Head:  Normocephalic, atraumatic; Eyes: EOMI, PERRL, No scleral icterus; ENMT: Mouth and pharynx normal, Mucous membranes moist; Neck: Supple, Full range of motion, No lymphadenopathy; Cardiovascular: Regular rate and rhythm, No murmur, rub, or gallop; Respiratory: Breath sounds clear & equal bilaterally, No rales, rhonchi, wheezes. Hyperventilating.; Chest: Nontender, Movement normal; Abdomen: Soft, +very mild mid-epigastric area tenderness  to palp. No rebound or guarding. Nondistended, Normal bowel sounds; Genitourinary: No CVA tenderness; Extremities: Pulses normal, No tenderness, No edema, No calf edema or asymmetry.; Neuro: AA&Ox3, Major CN grossly intact.  Speech clear. No gross focal motor or sensory deficits in extremities.; Skin: Color normal, Warm, Dry.; Psych:  Anxious.    ED Course  Procedures     MDM  MDM Reviewed:  previous chart, nursing note and vitals Reviewed previous: labs Interpretation: labs, x-ray and ultrasound   Results for orders placed during the hospital encounter of 08/01/13  CBC WITH DIFFERENTIAL      Result Value Ref Range   WBC 9.2  4.0 - 10.5 K/uL   RBC 4.63  3.87 - 5.11 MIL/uL   Hemoglobin 12.1  12.0 - 15.0 g/dL   HCT 59.537.8  63.836.0 - 75.646.0 %   MCV 81.6  78.0 - 100.0 fL   MCH 26.1  26.0 - 34.0 pg   MCHC 32.0  30.0 - 36.0 g/dL   RDW 43.313.0  29.511.5 - 18.815.5 %   Platelets 337  150 - 400 K/uL   Neutrophils Relative % 80 (*) 43 - 77 %   Neutro Abs 7.3  1.7 - 7.7 K/uL   Lymphocytes Relative 17  12 - 46 %   Lymphs Abs 1.6  0.7 - 4.0 K/uL   Monocytes Relative 3  3 - 12 %   Monocytes Absolute 0.3  0.1 - 1.0 K/uL   Eosinophils Relative 0  0 - 5 %   Eosinophils Absolute 0.0  0.0 - 0.7 K/uL   Basophils Relative 0  0 - 1 %   Basophils Absolute 0.0  0.0 - 0.1 K/uL  COMPREHENSIVE METABOLIC PANEL      Result Value Ref Range   Sodium 136 (*) 137 - 147 mEq/L   Potassium 4.2  3.7 - 5.3 mEq/L   Chloride 100  96 - 112 mEq/L   CO2 20  19 - 32 mEq/L   Glucose, Bld 115 (*) 70 - 99 mg/dL   BUN 12  6 - 23 mg/dL   Creatinine, Ser 4.160.70  0.50 - 1.10 mg/dL   Calcium 9.7  8.4 - 60.610.5 mg/dL   Total Protein 8.4 (*) 6.0 - 8.3 g/dL   Albumin 4.5  3.5 - 5.2 g/dL   AST 32  0 - 37 U/L   ALT 29  0 - 35 U/L   Alkaline Phosphatase 73  39 - 117 U/L   Total Bilirubin 0.5  0.3 - 1.2 mg/dL   GFR calc non Af Amer >90  >90 mL/min   GFR calc Af Amer >90  >90 mL/min  LIPASE, BLOOD      Result Value Ref Range   Lipase 24  11 - 59 U/L  PREGNANCY, URINE      Result Value Ref Range   Preg Test, Ur NEGATIVE  NEGATIVE  URINALYSIS, ROUTINE W REFLEX MICROSCOPIC      Result Value Ref Range   Color, Urine YELLOW  YELLOW   APPearance CLEAR  CLEAR   Specific Gravity, Urine 1.026  1.005 - 1.030   pH 7.5  5.0 - 8.0   Glucose, UA NEGATIVE  NEGATIVE mg/dL   Hgb urine dipstick MODERATE (*) NEGATIVE   Bilirubin Urine NEGATIVE   NEGATIVE   Ketones, ur NEGATIVE  NEGATIVE mg/dL   Protein, ur 301100 (*) NEGATIVE mg/dL   Urobilinogen, UA 0.2  0.0 - 1.0 mg/dL   Nitrite NEGATIVE  NEGATIVE   Leukocytes,  UA SMALL (*) NEGATIVE  URINE MICROSCOPIC-ADD ON      Result Value Ref Range   Squamous Epithelial / LPF FEW (*) RARE   WBC, UA 3-6  <3 WBC/hpf   RBC / HPF 21-50  <3 RBC/hpf   Bacteria, UA RARE  RARE   Urine-Other MUCOUS PRESENT     Dg Abd Acute W/chest 08/01/2013   CLINICAL DATA:  Nausea and vomiting beginning this evening.  EXAM: ACUTE ABDOMEN SERIES (ABDOMEN 2 VIEW & CHEST 1 VIEW)  COMPARISON:  None.  FINDINGS: Normal heart size and pulmonary vascularity.  Lungs are clear.  Normal bowel gas pattern with scattered stool in the colon. No small or large bowel distention. No free intra-abdominal air. Increased density in the stomach is likely due to ingested material such as Pepto-Bismol. Intrauterine device projected over the pelvis.  IMPRESSION: No evidence of active pulmonary disease. Nonobstructive bowel gas pattern.   Electronically Signed   By: Burman NievesWilliam  Stevens M.D.   On: 08/01/2013 05:31   [place] Abdomen Limited Ruq 08/01/2013   CLINICAL DATA:  Upper abdominal pain and emesis.  EXAM: US ABDOMEN LIMITED - RIGHT UPPER QUADRANT  COMPARISON:  None.  FINDINGS: Gallbladder:  No gallstones or wall thickening visualized. No sonographic Murphy sign noted.  Common bile duct:  Diameter: 4.3 mm, normal  Liver:  No focal lesion identified. Within normal limits in parenchymal echogenicity.  IMPRESSION: No sonographic evidence of cholelithiasis or cholecystitis.   Electronically Signed   By: Burman NievesWilliam  Stevens M.D.   On: 08/01/2013 06:16    0655:  Pt continued to c/o epigastric pain despite normal labs. Imaging studies reassuring. Pt has tol PO well while in the ED without N/V.  No stooling while in the ED. Abd benign, VSS. States she wants to go home now. Dx and testing d/w pt and family.  Questions answered.  Verb understanding, agreeable to d/c  home with outpt f/u.      Laray AngerKathleen M McManus, DO 08/03/13 [phone]

## 2013-08-01 NOTE — ED Notes (Signed)
Pt was unable to communicate well with physician due to hyperventilation.  Pt breathing now calming down, appears more alert.

## 2013-08-01 NOTE — ED Notes (Signed)
Pt given water for PO challenge 

## 2013-08-01 NOTE — Discharge Instructions (Signed)
°Emergency Department Resource Guide °1) Find a Doctor and Pay Out of Pocket °Although you won't have to find out who is covered by your insurance plan, it is a good idea to ask around and get recommendations. You will then need to call the office and see if the doctor you have chosen will accept you as a new patient and what types of options they offer for patients who are self-pay. Some doctors offer discounts or will set up payment plans for their patients who do not have insurance, but you will need to ask so you aren't surprised when you get to your appointment. ° °2) Contact Your Local Health Department °Not all health departments have doctors that can see patients for sick visits, but many do, so it is worth a call to see if yours does. If you don't know where your local health department is, you can check in your phone book. The CDC also has a tool to help you locate your state's health department, and many state websites also have listings of all of their local health departments. ° °3) Find a Walk-in Clinic °If your illness is not likely to be very severe or complicated, you may want to try a walk in clinic. These are popping up all over the country in pharmacies, drugstores, and shopping centers. They're usually staffed by nurse practitioners or physician assistants that have been trained to treat common illnesses and complaints. They're usually fairly quick and inexpensive. However, if you have serious medical issues or chronic medical problems, these are probably not your best option. ° °No Primary Care Doctor: °- Call Health Connect at  832-8000 - they can help you locate a primary care doctor that  accepts your insurance, provides certain services, etc. °- Physician Referral Service- 1-800-533-3463 ° °Chronic Pain Problems: °Organization         Address  Phone   Notes  °[facility]  (336) 297-2271 Patients need to be referred by their primary care doctor.  ° °Medication  Assistance: °Organization         Address  Phone   Notes  °[place] [facility] [address] °[place], [place] [address] (336) 641-8030 --Must be a resident of [place] °-- Must have NO insurance coverage whatsoever (no Medicaid/ Medicare, etc.) °-- The pt. MUST have a primary care doctor that directs their care regularly and follows them in the community °  °[facility]  (866) 331-1348   °[employer]  (888) 892-1162   ° °Agencies that provide inexpensive medical care: °Organization         Address  Phone   Notes  °[facility]  (336) 832-8035   °[facility]    (336) 832-7272   °[facility] [address] °[place], [place] [address] (336) 832-4777   °[facility] [address], °[place] (336) 271-4999   °[facility]    (336) 373-0678   °[facility]    (336) 272-1050   °[facility] ° [address], [place] Phone:  (336) 832-4444, Fax:  (336) 832-4440 Hours of Operation:  9 am - 6 pm, M-F.  Also accepts Medicaid/Medicare and self-pay.  °[facility] ° [address], [place] Phone: (336) 832-3150, Fax: (336) [phone]. Hours of Operation:  8:30 am - 5:30 pm, M-F.  Also accepts Medicaid and self-pay.  °[facility] [place] [address], [place] Phone: (336) 878-6027   °[facility] [address], [place], [place] (336)723-1848, Ext. 123 Mondays & Thursdays: 7-9 AM.  First 15 patients are seen on a first come, first serve basis. °  ° °Medicaid-accepting [place] Providers: ° °Organization         Address  Phone   Notes  °[facility] [address], [place] (336) 641-2100 Also accepts self-pay patients.  °[facility] [address], [place] ° (336) 856-9996   °[facility] [address], [place]  (336) 288-8857   °[facility] [address] [place] [address], [place] (336) 299-7000   °Veita Bland [address], [place]  ° (336) 373-1557 Only accepts [place] Access Medicaid patients after they have their name applied to their card.  ° °Self-Pay (no insurance) in [place]: ° °Organization         Address  Phone   Notes  °Sickle Cell Patients, [facility] [address], [place] (336) 832-1970   °[facility] [address], [place] (336) 832-4400   °[facility] ° [address], [place] (336) 992-4800   °[facility]/Dr. Osei-Bonsu ° [address] [place] [address], [place] or [address], [place] (336) 841-8500 Phone number for both [place] and [place] locations is the same.  °[facility] [address], [place] (336) 299-0000   °[facility] [address] [place] [address], [place] or [address] (336) 852-7530 °(336) 878-2260   °[facility] [address], [place] (336) 350-1642, phone; (336) 294-5005, fax Sees patients 1st and 3rd Saturday of every month.  Must not qualify for public or private insurance (i.e. Medicaid, Medicare, [place] Health Choice, Veterans' Benefits) • Household income should be no more than 200% of the poverty level •The clinic cannot treat you if you are pregnant or think you are pregnant • Sexually transmitted diseases are not treated at the clinic.  ° ° °Dental Care: °Organization         Address  Phone  Notes  °[place] [facility] [address], [place] (336) 641-6152 Accepts children up to age 21 who are enrolled in Medicaid or [place] Health Choice; pregnant women with a Medicaid card; and children who have applied for Medicaid or [place] Health Choice, but were declined, whose parents can pay a reduced fee at time of service.  °[place]  [facility] [place]  [address], [place] (336) 641-7733 Accepts children up to age 21 who are enrolled in Medicaid or [place] Health Choice; pregnant women with a Medicaid card; and children who have applied for Medicaid or [place] Health Choice, but were declined, whose parents can pay a reduced fee at time of service.  °[facility] ° [address], [place] (336) 641-4533 Patients are seen by appointment only. Walk-ins are not accepted. [facility] will see patients 18 years of age and older. °Monday - Tuesday (8am-5pm) °Most Wednesdays (8:30-5pm) °$30 per visit, cash only  °[facility] ° [address], [place] (336) 641-4533 Patients are seen by appointment only. Walk-ins are not accepted. [facility] will see patients 18 years of age and older. °One   Wednesday Evening (Monthly: Volunteer Based).  $30 per visit, cash only  °[facility]  (919) 537-3737 for adults; Children under age 4, call Graduate Pediatric Dentistry at ([phone]) [phone]. Children aged 4-14, please call (919) [phone] to request a pediatric application. ° Dental services are provided in all areas of dental care including fillings, crowns and bridges, complete and partial dentures, implants, gum treatment, root canals, and extractions. Preventive care is also provided. Treatment is provided to both adults and children. °Patients are selected via a lottery and there is often a waiting list. °  °[facility] [address], °[place] ° (336) 763-8833 [email or web address] °  °[facility] [address], [place], [place] (336)723-1848, Ext. 123 Second and Fourth Thursday of each month, opens at 6:30 AM; Clinic ends at 9 AM.  Patients are seen on a first-come first-served basis, and a limited number are seen during each clinic.  ° °[facility] ° [address], [place], [place] (336) 723-7904    Eligibility Requirements °You must have lived in [place], [place], or [place] counties for at least the last three months. °  You cannot be eligible for state or federal sponsored healthcare insurance, including Veterans Administration, Medicaid, or Medicare. °  You generally cannot be eligible for healthcare insurance through your employer.  °  How to apply: °Eligibility screenings are held every Tuesday and Wednesday afternoon from 1:00 pm until 4:00 pm. You do not need an appointment for the interview!  °[facility] [address], [place], [place] 336-631-2330   °[facility]  336-342-8273   °[facility]  336-703-3100   °[facility]  336-570-6415   ° °Behavioral Health Resources in the Community: °Intensive Outpatient Programs °Organization         Address  Phone  Notes  °[facility] [address], [place], [place] 336-878-6098   °[facility] [address], [place], [place] 336-832-9800   °[facility] [address], [place], [place] ° 336-882-2125   °[facility] [address],  °[place], [place] 1-800-853-5163 or 336-641-4981   °Substance Abuse Resources °Organization         Address  Phone  Notes  °[facility]  336-882-2125   °[facility]  336-784-9470   °[facility]  336-285-9073   °[facility]  336-845-3988   °Residential & Outpatient Substance Abuse Program  1-800-659-3381   °Psychological Services °Organization         Address  Phone  Notes  °[facility]  336- 832-9600   °[facility]  336- 378-7881   °[facility] [address], [place] 1-800-853-5163 or 336-641-4981   ° °Mobile Crisis Teams °Organization         Address  Phone  Notes  °[facility], Mobile Crisis Care Unit  1-877-626-1772   °Assertive °[facility] ° [address]  [place], [place] 336-834-9664   °Sharon DeEsch [address] °[place] 336-554-5454   ° °Self-Help/Support Groups °Organization         Address  Phone             Notes  °[facility] of  - variety of support groups  336- 373-1402 Call for more information  °Narcotics Anonymous (NA), [facility] [address], °[place]  2 meetings at this location  ° °  Residential Treatment Programs Organization         Address  Phone  Notes  [facility] [address],    [place]  [phone]   [facility]  [address], [place] [phone], [place], [place] [phone]   [facility] [address], [place] [phone] Admissions: 8am-3pm M-F  [facility] [address]    [place], [place] [phone]   [facility] [address], [place], [place] [phone]   [facility] [address]  [place], [place] [phone]   [facility] Intensive Outpatient [address], [place], [place] [phone]   [facility] ([facility] [address]  [place], [place] [phone] or [phone]   [facility] ([facility] [address] [place], [place] [phone])[phone] Accepts Medicaid  [facility] [address]  [place] [phone] Substance Abuse/Addiction Treatment   [facility] Organization         Address  Phone  Notes  [facility]  639-134-9880(888) (276) [phone]   Angie FavaJulie Brannon, PhD [address] [place], [place]   ([phone]) [phone](336) [phone] or [phone](336) [phone]   [facility]   [address] [place], [place] [phone](336) [phone]   [facility] [address], [place], [place] [phone](336) ([phone]) [phone] Insurance/Medicaid/sponsorship through [facility] [address]                                    [place], [place] [phone](336) [phone]) [phone] Therapy/tele-psych/case    [facility] [address]   [place], [place] [phone]) [phone]    Dr. Lolly MustacheArfeen  [phone](336) [phone]   [facility]  [employer] [facility] 1) [address], [place] 2) [address], [place] 3)  [address], [place] [phone](336) [phone](336) [phone]  [phone](336) [phone]   [facility] Child Abuse Hotline [phone](336) [phone])[phone] or ([phone](336) [phone] (After Hours)      Eat a bland diet, avoiding greasy, fatty, fried foods, as well as spicy and acidic foods or beverages.  Avoid eating within the hour or 2 before going to bed or laying down.  Also avoid teas, colas, coffee, chocolate, pepermint and spearment.  Take over the counter pepcid, one tablet by mouth twice a day, for the next 2 to 3 weeks.  May also take over the counter maalox/mylanta, as directed on packaging, as needed for discomfort.  Take the prescription as directed.  Call your regular medical doctor today to schedule a follow up appointment within the next 2 days.  Return to the Emergency Department immediately if worsening.

## 2013-08-01 NOTE — ED Notes (Signed)
The pt is c/o epigastric pain  With nv since  1830.  lmp iud

## 2013-08-01 NOTE — ED Notes (Signed)
No emesis with PO challenge

## 2013-12-12 ENCOUNTER — Encounter (HOSPITAL_COMMUNITY): Payer: Self-pay | Admitting: Emergency Medicine

## 2014-08-08 IMAGING — CR DG ABDOMEN ACUTE W/ 1V CHEST
3 series · 3 of 3 positions shown · non-contrast
Comparison: None.

CLINICAL DATA: Nausea and vomiting beginning this evening.

EXAM:
ACUTE ABDOMEN SERIES (ABDOMEN 2 VIEW & CHEST 1 VIEW)

[w chest pa]
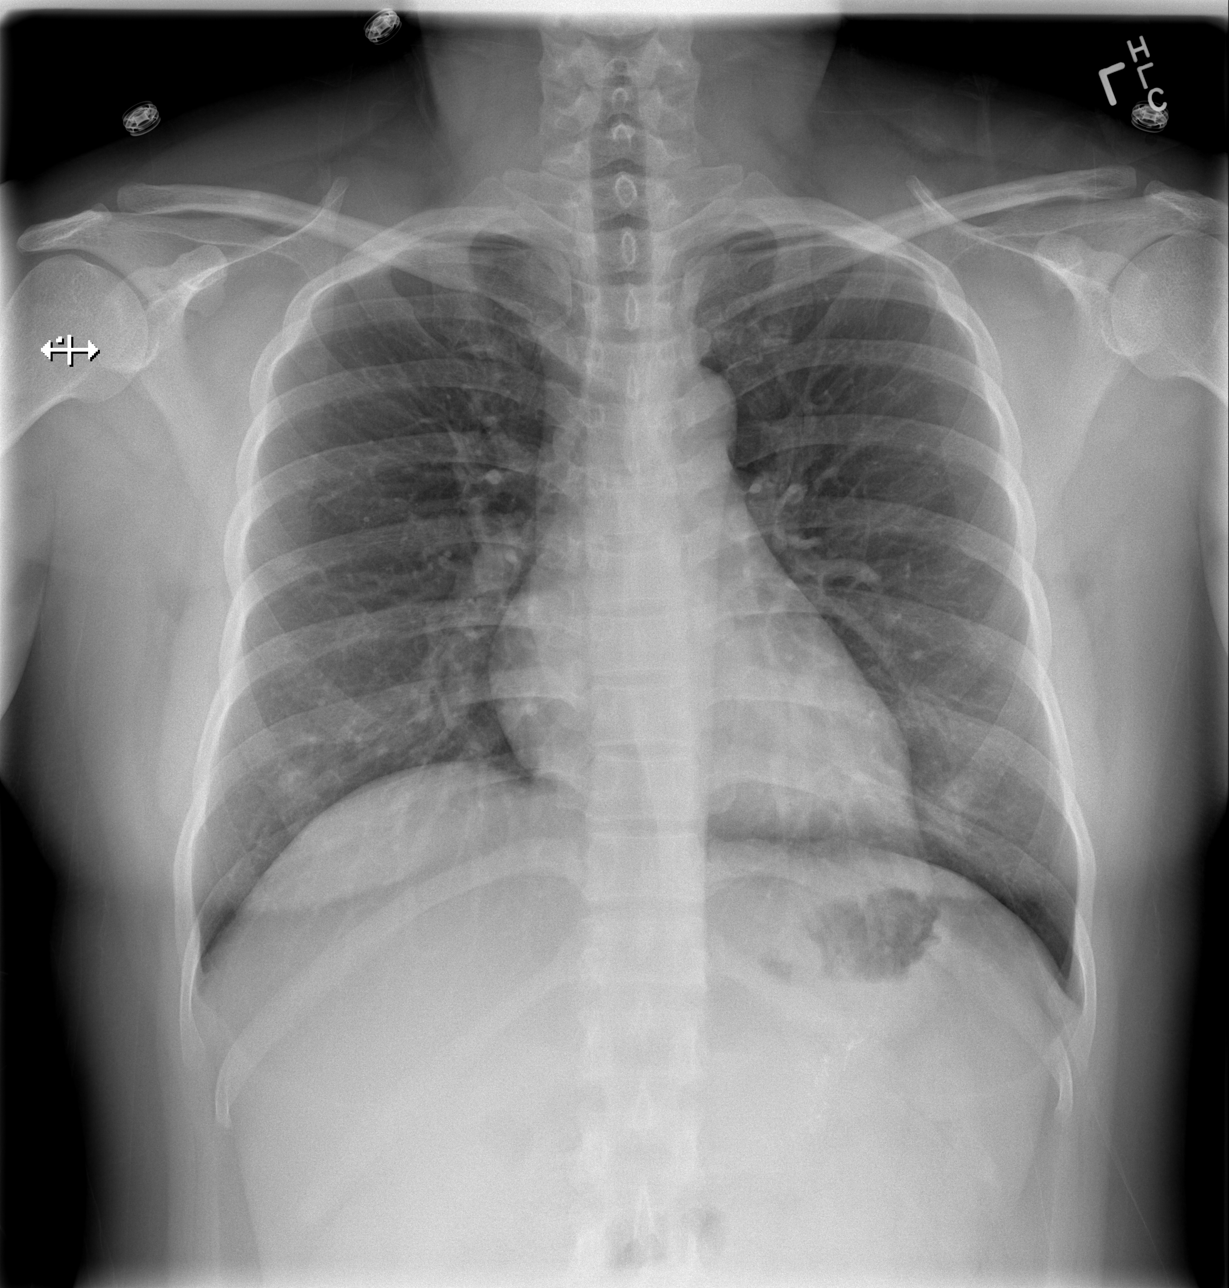

[w abdomen upright]
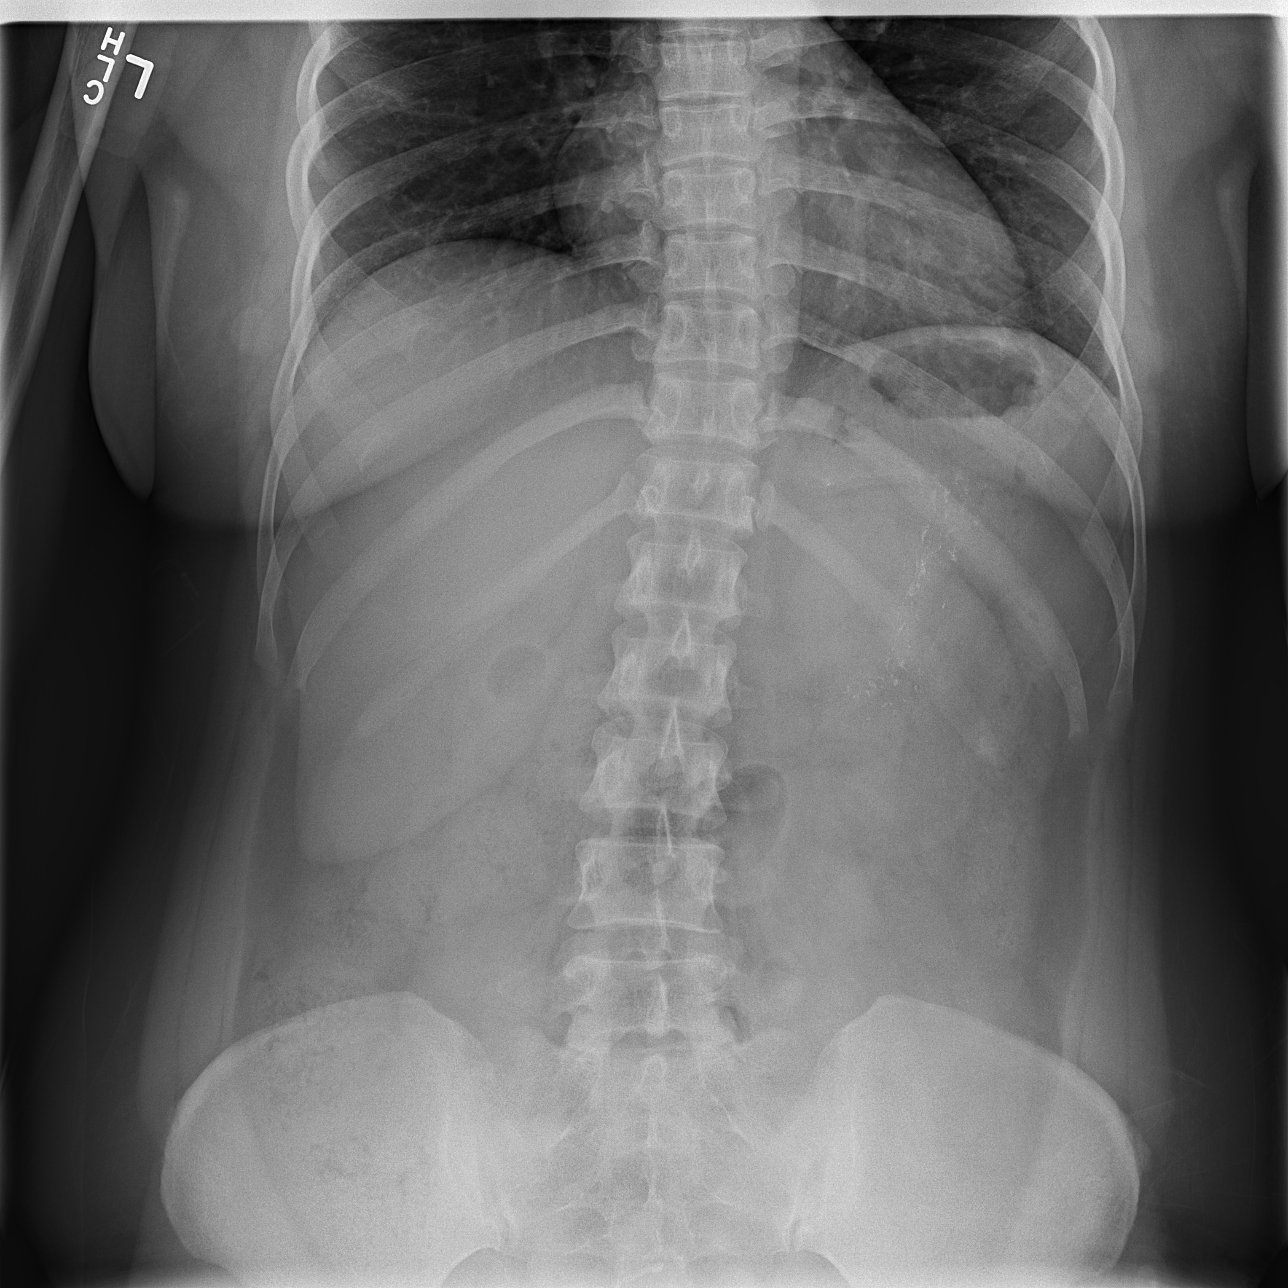

[t abdomen supine]
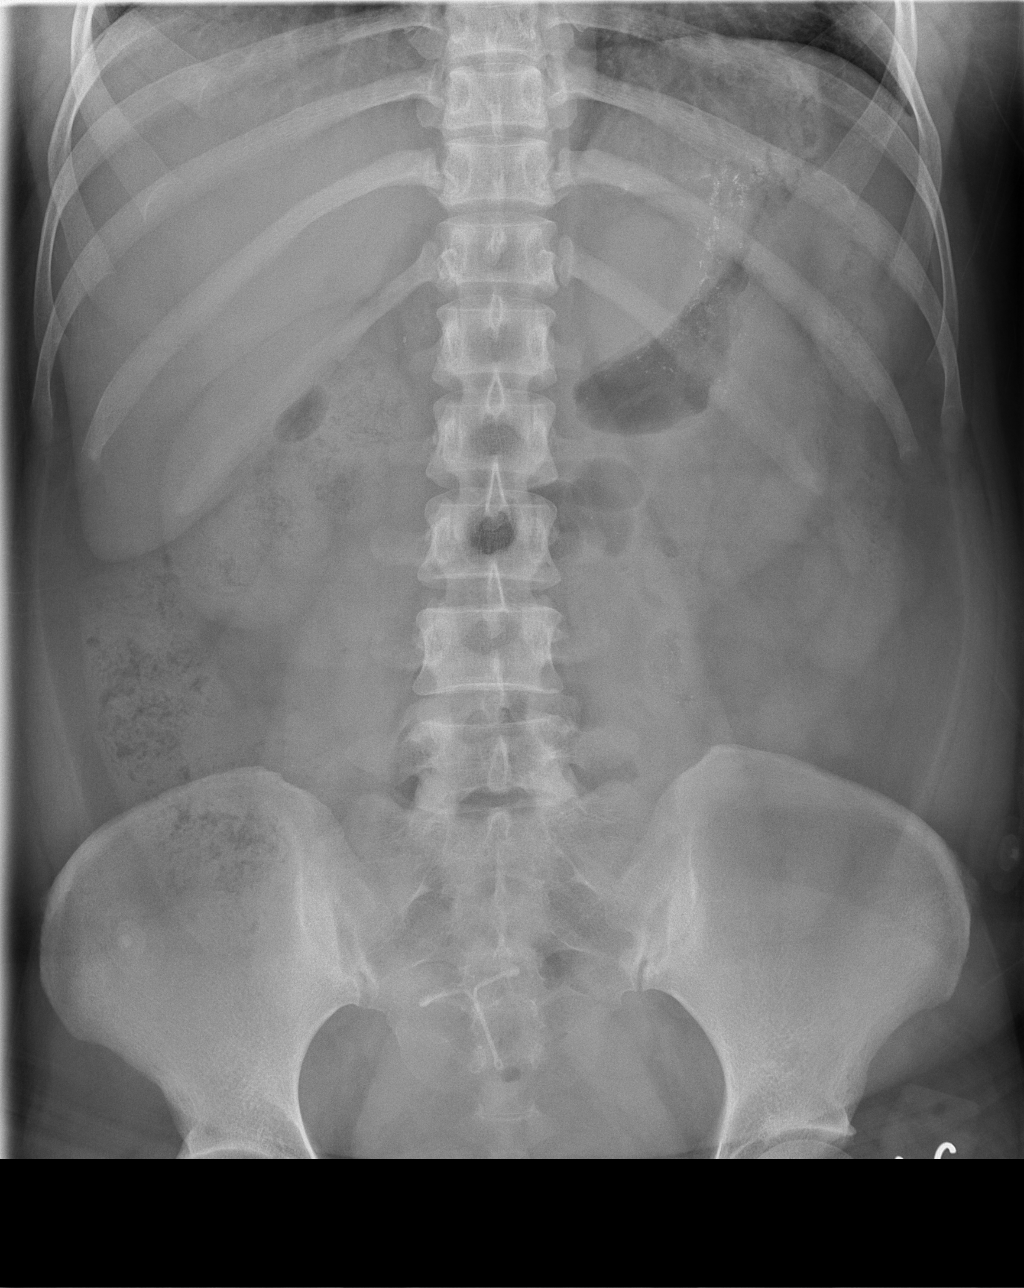

[3 of 3 positions shown; findings below may reference images not displayed]

FINDINGS: Normal heart size and pulmonary vascularity.  Lungs are clear.

Normal bowel gas pattern with scattered stool in the colon. No small
or large bowel distention. No free intra-abdominal air. Increased
density in the stomach is likely due to ingested material such as
Pepto-Bismol. Intrauterine device projected over the pelvis.
IMPRESSION: No evidence of active pulmonary disease. Nonobstructive bowel gas
pattern.

## 2014-08-08 IMAGING — US US ABDOMEN LIMITED
1 series · 14 of 25 positions shown · non-contrast
Comparison: None.

CLINICAL DATA: Upper abdominal pain and emesis.

EXAM:
US ABDOMEN LIMITED - RIGHT UPPER QUADRANT

[Series 1: us abdomen limited · 0.27mm/px · 14 of 41 slices shown]
[im 1/41]
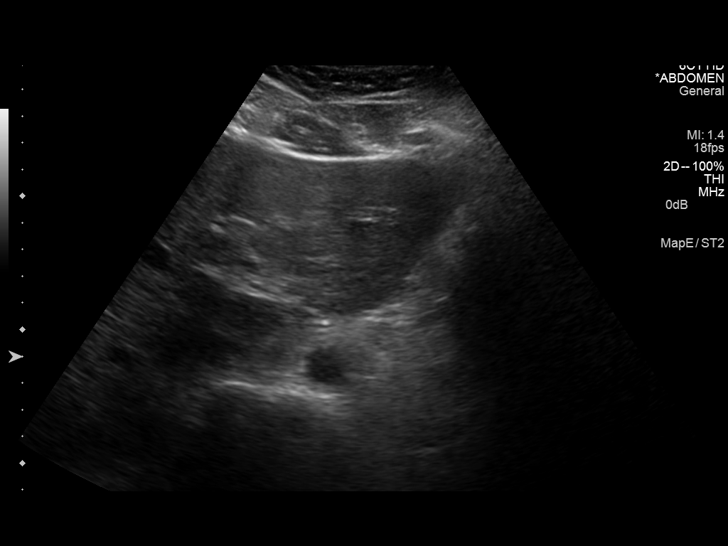
[im 4/41]
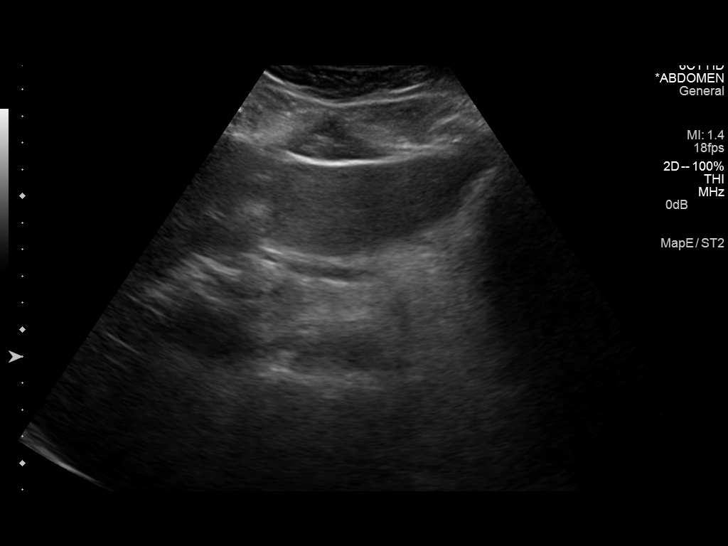
[im 7/41]
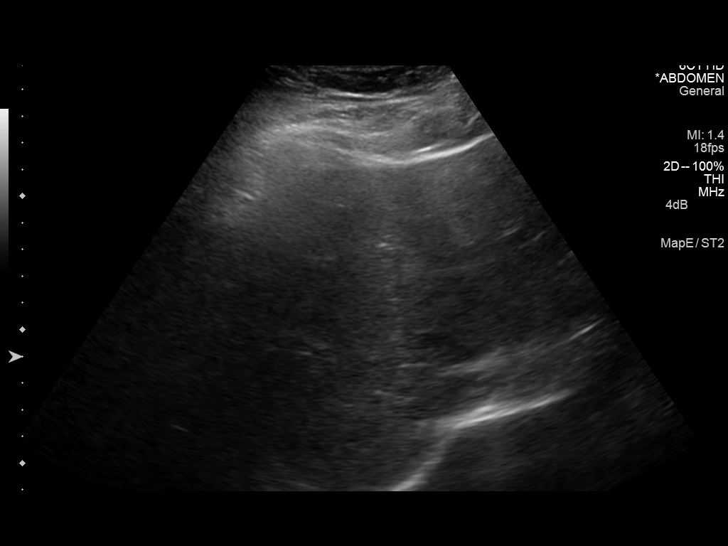
[im 11/41]
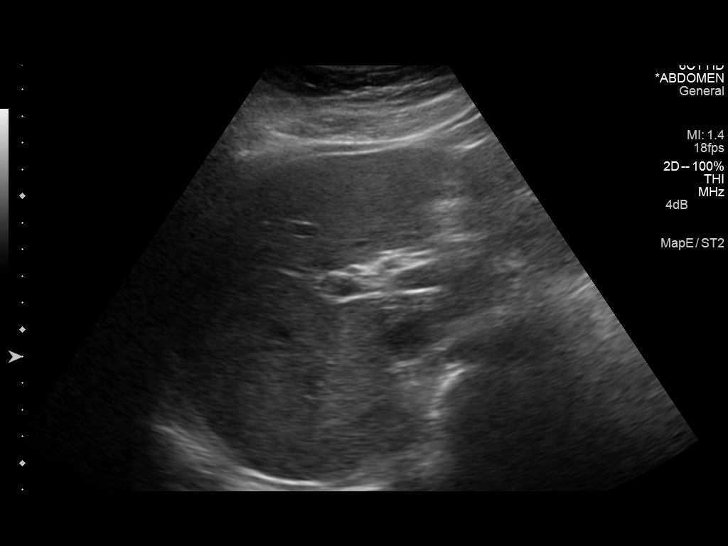
[im 14/41]
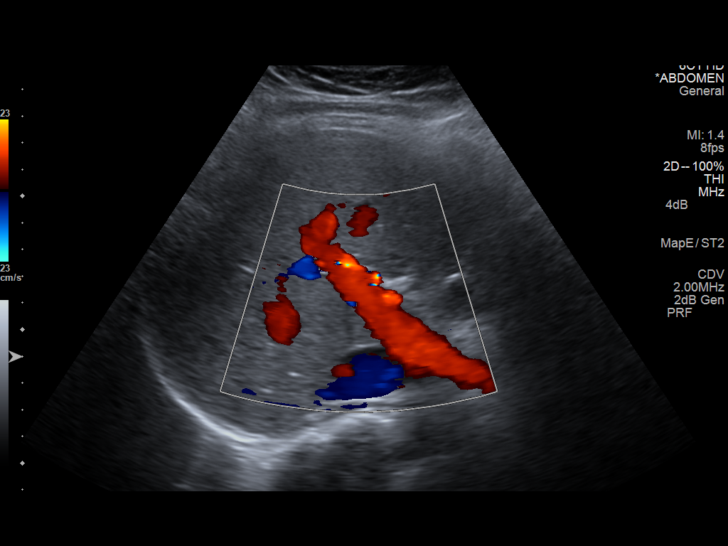
[im 16/41]
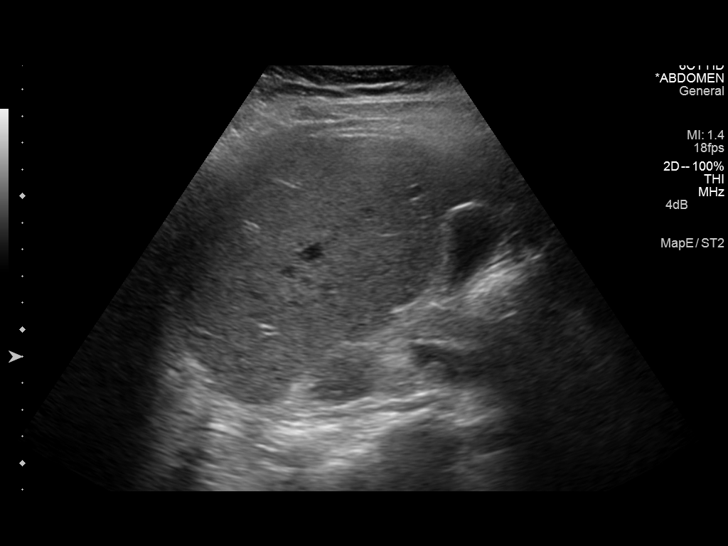
[im 19/41]
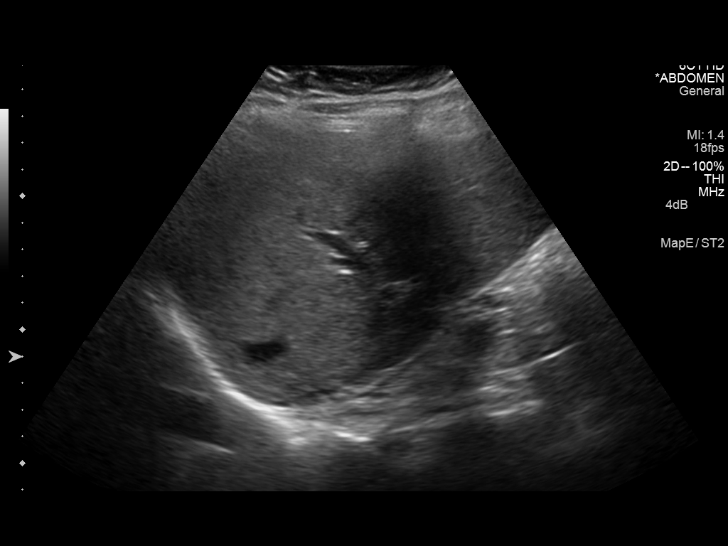
[im 22/41]
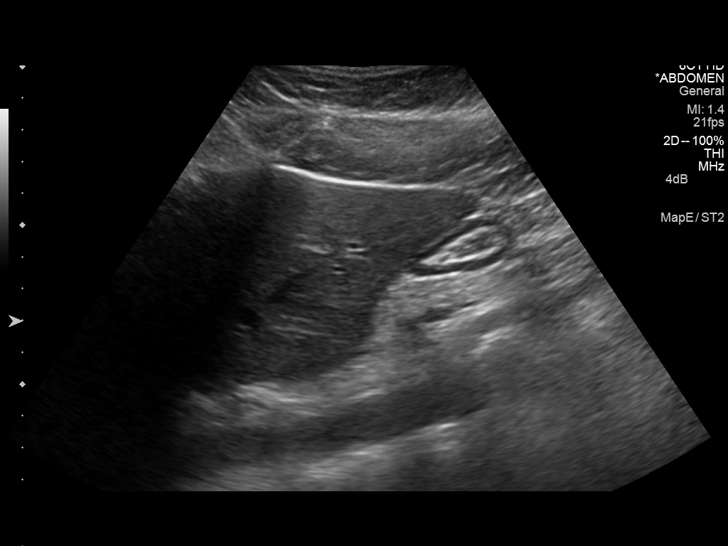
[im 26/41]
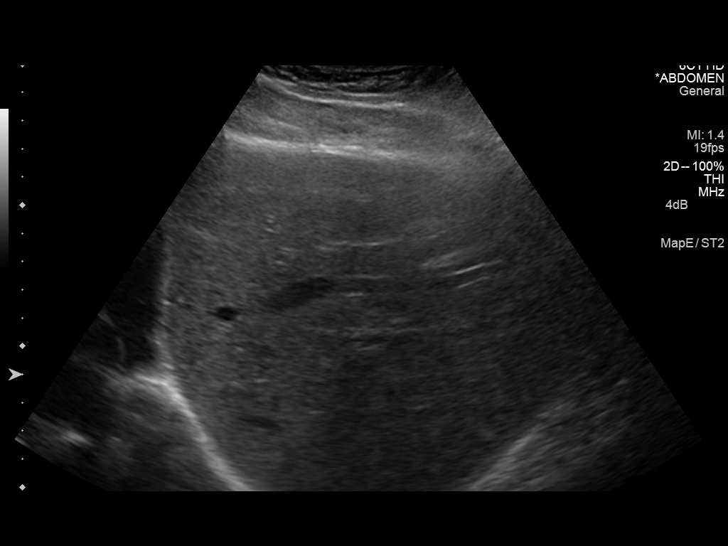
[im 27/41]
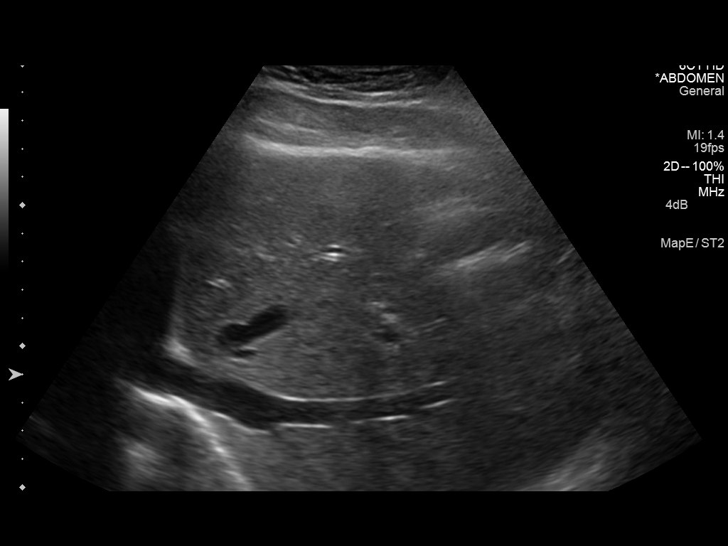
[im 31/41]
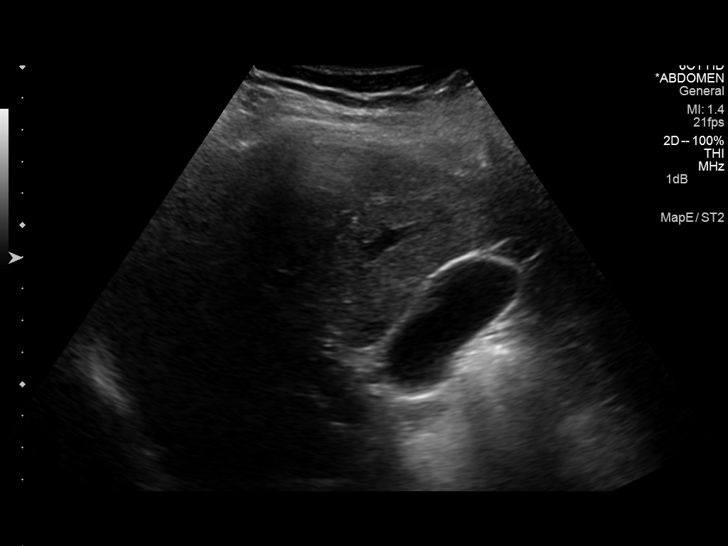
[im 34/41]
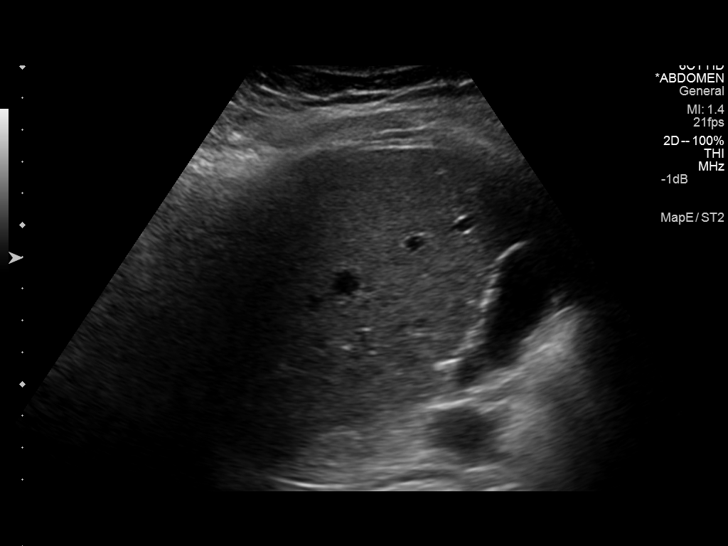
[im 37/41]
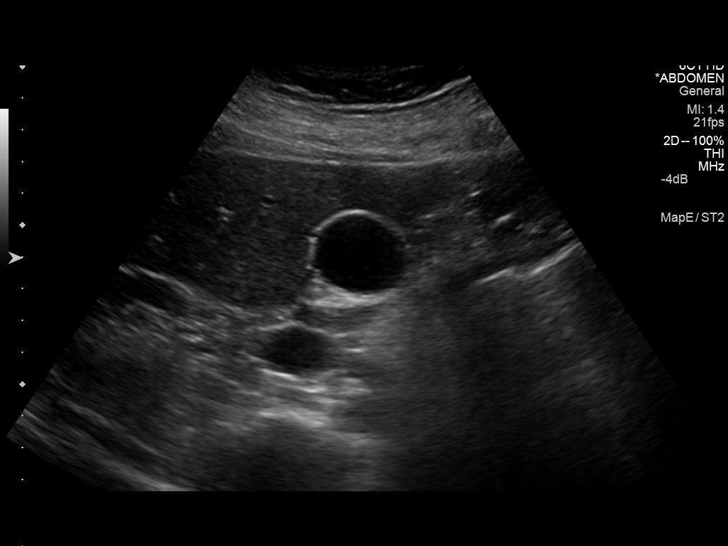
[im 41/41]
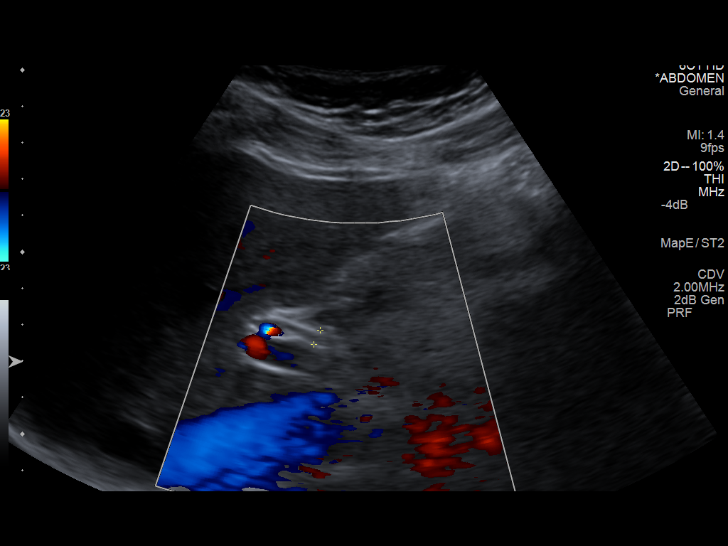

[14 of 25 positions shown; findings below may reference images not displayed]

FINDINGS: Gallbladder:

No gallstones or wall thickening visualized. No sonographic Murphy
sign noted.

Common bile duct:

Diameter: 4.3 mm, normal

Liver:

No focal lesion identified. Within normal limits in parenchymal
echogenicity.
IMPRESSION: No sonographic evidence of cholelithiasis or cholecystitis.

## 2015-02-11 NOTE — L&D Delivery Note (Signed)
Delivery Note At 10:42 PM a viable female was delivered via  (Presentation: DOA ;  ).  APGAR: ,per RN ; weight  .  pending Placenta status: ,intact, spontaneous .  Cord: 3VC with the following complications: .none  Cord pH: not indicated  Anesthesia:  epidural Episiotomy:  none Lacerations:  1st degree distal mucosal Suture Repair: 3.0 vicryl rapide single figure of 8 for hemostasis Est. Blood Loss (mL):  300  Mom to postpartum.  Baby to Couplet care / Skin to Skin.  Cristina Smith A. 11/28/2015, 10:55 PM

## 2015-04-09 ENCOUNTER — Emergency Department (HOSPITAL_COMMUNITY)
Admission: EM | Admit: 2015-04-09 | Discharge: 2015-04-09 | Disposition: A | Discharge status: Home / Self Care | Payer: BLUE CROSS/BLUE SHIELD | Source: Home / Self Care | Attending: Family Medicine | Admitting: Family Medicine

## 2015-04-09 ENCOUNTER — Encounter (HOSPITAL_COMMUNITY): Payer: Self-pay | Admitting: *Deleted

## 2015-04-09 DIAGNOSIS — J069 Acute upper respiratory infection, unspecified: Secondary | ICD-10-CM | POA: Diagnosis not present

## 2015-04-09 MED ORDER — IPRATROPIUM BROMIDE 0.06 % NA SOLN: 2.0000 | Freq: Four times a day (QID) | NASAL | Status: DC

## 2015-04-09 NOTE — ED Provider Notes (Signed)
CSN: [id number]     Arrival date & time 04/09/15  1823 History   First MD Initiated Contact with Patient 04/09/15 1949     Chief Complaint  Patient presents with  . Sore Throat   (Consider location/radiation/quality/duration/timing/severity/associated sxs/prior Treatment) Patient is a 30 y.o. female presenting with pharyngitis. The history is provided by the patient.  Sore Throat This is a new problem. The current episode started more than 1 week ago (2-3 wks of sx.). The problem has not changed since onset.Pertinent negatives include no chest pain, no abdominal pain, no headaches and no shortness of breath. The symptoms are aggravated by swallowing.    Past Medical History  Diagnosis Date  . Medical history non-contributory   . SVD (spontaneous vaginal delivery) 07/25/2012   Past Surgical History  Procedure Laterality Date  . No past surgeries     Family History  Problem Relation Age of Onset  . Hypertension Mother   . Cancer Father    Social History  Substance Use Topics  . Smoking status: Never Smoker   . Smokeless tobacco: Never Used  . Alcohol Use: No   OB History    Gravida Para Term Preterm AB TAB SAB Ectopic Multiple Living   Review of Systems  Constitutional: Negative.   HENT: Positive for congestion and postnasal drip.   Respiratory: Negative.  Negative for cough and shortness of breath.   Cardiovascular: Negative for chest pain.  Gastrointestinal: Negative for abdominal pain.  Genitourinary: Positive for menstrual problem.  Neurological: Negative for headaches.  All other systems reviewed and are negative.   Allergies  Review of patient's allergies indicates no known allergies.  Home Medications   Prior to Admission medications   Medication Sig Start Date End Date Taking? Authorizing Provider  fexofenadine (ALLEGRA) 180 MG tablet Take 180 mg by mouth daily.    Historical Provider, MD  ibuprofen (ADVIL,MOTRIN) 200 MG tablet Take 400  mg by mouth every 6 (six) hours as needed for mild pain.    Historical Provider, MD  Levonorgestrel (MIRENA IU) 1 Device by Intrauterine route once.    Historical Provider, MD  ondansetron (ZOFRAN ODT) 4 MG disintegrating tablet Take 1 tablet (4 mg total) by mouth every 8 (eight) hours as needed for nausea or vomiting. 08/01/13   Samuel Jester, DO   Meds Ordered and Administered this Visit  Medications - No data to display  BP 124/74 mmHg  Pulse 78  Temp(Src) 98.6 F (37 C) (Oral)  Resp 16  SpO2 100%  LMP  No data found.   Physical Exam  Constitutional: She is oriented to person, place, and time. She appears well-developed and well-nourished. No distress.  HENT:  Right Ear: External ear normal.  Left Ear: External ear normal.  Mouth/Throat: Oropharynx is clear and moist.  Eyes: Pupils are equal, round, and reactive to light.  Neck: Normal range of motion. Neck supple.  Cardiovascular: Normal rate, regular rhythm, normal heart sounds and intact distal pulses.   Pulmonary/Chest: Effort normal and breath sounds normal.  Abdominal: Soft. Bowel sounds are normal. There is no tenderness.  Lymphadenopathy:    She has no cervical adenopathy.  Neurological: She is alert and oriented to person, place, and time.  Skin: Skin is warm and dry.  Nursing note and vitals reviewed.   ED Course  Procedures (including critical care time)  Labs Review Labs Reviewed - No data to display  Imaging Review No results found.   Visual Acuity Review  Right Eye Distance:   Left Eye Distance:   Bilateral Distance:    Right Eye Near:   Left Eye Near:    Bilateral Near:         MDM  No diagnosis found.  Meds ordered this encounter  Medications  . ipratropium (ATROVENT) 0.06 % nasal spray    Sig: Place 2 sprays into both nostrils 4 (four) times daily.    Dispense:  15 mL    Refill:  1      Linna Hoff, MD 04/09/15 2005

## 2015-04-09 NOTE — ED Notes (Signed)
Pt reports    Symptoms  Of  sorethroat      With  Pain  When  She  Swallows     Symptoms  X  2-3   Weeks      PT    REPORTS      SHE IS  2 .5  MONTHS     APPROX  PREG       Pt   Reports  Symptoms  Not  releived  By  otc  meds

## 2015-04-09 NOTE — Discharge Instructions (Signed)
Drink plenty of fluids as discussed, use medicine as prescribed, and mucinex or delsym for cough. Return or see your doctor if further problems °

## 2015-08-15 ENCOUNTER — Inpatient Hospital Stay (HOSPITAL_COMMUNITY)
Admission: AD | Admit: 2015-08-15 | Payer: BLUE CROSS/BLUE SHIELD | Source: Ambulatory Visit | Admitting: Obstetrics & Gynecology

## 2015-11-12 LAB — OB RESULTS CONSOLE GBS: STREP GROUP B AG: NEGATIVE

## 2015-11-28 ENCOUNTER — Inpatient Hospital Stay (HOSPITAL_COMMUNITY): Payer: BLUE CROSS/BLUE SHIELD | Admitting: Anesthesiology

## 2015-11-28 ENCOUNTER — Encounter (HOSPITAL_COMMUNITY): Payer: Self-pay | Admitting: *Deleted

## 2015-11-28 ENCOUNTER — Inpatient Hospital Stay (HOSPITAL_COMMUNITY)
Admission: AD | Admit: 2015-11-28 | Discharge: 2015-11-30 | DRG: 775 | Disposition: A | Discharge status: Home / Self Care | Payer: BLUE CROSS/BLUE SHIELD | Source: Ambulatory Visit | Attending: Obstetrics | Admitting: Obstetrics

## 2015-11-28 DIAGNOSIS — Z8249 Family history of ischemic heart disease and other diseases of the circulatory system: Secondary | ICD-10-CM

## 2015-11-28 DIAGNOSIS — O3663X0 Maternal care for excessive fetal growth, third trimester, not applicable or unspecified: Secondary | ICD-10-CM | POA: Diagnosis present

## 2015-11-28 DIAGNOSIS — Z6839 Body mass index (BMI) 39.0-39.9, adult: Secondary | ICD-10-CM | POA: Diagnosis not present

## 2015-11-28 DIAGNOSIS — O134 Gestational [pregnancy-induced] hypertension without significant proteinuria, complicating childbirth: Secondary | ICD-10-CM | POA: Diagnosis present

## 2015-11-28 DIAGNOSIS — Z3A37 37 weeks gestation of pregnancy: Secondary | ICD-10-CM | POA: Diagnosis not present

## 2015-11-28 DIAGNOSIS — O139 Gestational [pregnancy-induced] hypertension without significant proteinuria, unspecified trimester: Secondary | ICD-10-CM | POA: Diagnosis present

## 2015-11-28 DIAGNOSIS — O99214 Obesity complicating childbirth: Secondary | ICD-10-CM | POA: Diagnosis present

## 2015-11-28 DIAGNOSIS — O1490 Unspecified pre-eclampsia, unspecified trimester: Secondary | ICD-10-CM | POA: Diagnosis present

## 2015-11-28 HISTORY — DX: Carpal tunnel syndrome, unspecified upper limb: G56.00

## 2015-11-28 LAB — COMPREHENSIVE METABOLIC PANEL
ALBUMIN: 3 g/dL — AB (ref 3.5–5.0)
ALT: 24 U/L (ref 14–54)
ANION GAP: 8 (ref 5–15)
AST: 26 U/L (ref 15–41)
Alkaline Phosphatase: 142 U/L — ABNORMAL HIGH (ref 38–126)
BILIRUBIN TOTAL: 0.4 mg/dL (ref 0.3–1.2)
BUN: 17 mg/dL (ref 6–20)
CALCIUM: 8.6 mg/dL — AB (ref 8.9–10.3)
CO2: 19 mmol/L — AB (ref 22–32)
Chloride: 103 mmol/L (ref 101–111)
Creatinine, Ser: 0.62 mg/dL (ref 0.44–1.00)
GFR calc non Af Amer: >60 mL/min (ref >60)
GLUCOSE: 168 mg/dL — AB (ref 65–99)
POTASSIUM: 4.2 mmol/L (ref 3.5–5.1)
SODIUM: 130 mmol/L — AB (ref 135–145)
TOTAL PROTEIN: 6.2 g/dL — AB (ref 6.5–8.1)

## 2015-11-28 LAB — CBC
HCT: 38 % (ref 36.0–46.0)
HEMOGLOBIN: 12.4 g/dL (ref 12.0–15.0)
MCH: 26.9 pg (ref 26.0–34.0)
MCHC: 32.6 g/dL (ref 30.0–36.0)
MCV: 82.4 fL (ref 78.0–100.0)
PLATELETS: 266 10*3/uL (ref 150–400)
RBC: 4.61 MIL/uL (ref 3.87–5.11)
RDW: 16.2 % — ABNORMAL HIGH (ref 11.5–15.5)
WBC: 9 10*3/uL (ref 4.0–10.5)

## 2015-11-28 LAB — OB RESULTS CONSOLE HIV ANTIBODY (ROUTINE TESTING): HIV: NONREACTIVE

## 2015-11-28 LAB — PROTEIN / CREATININE RATIO, URINE
Creatinine, Urine: 42 mg/dL
PROTEIN CREATININE RATIO: 0.71 mg/mg{creat} — AB (ref 0.00–0.15)
TOTAL PROTEIN, URINE: 30 mg/dL

## 2015-11-28 LAB — OB RESULTS CONSOLE RPR: RPR: NONREACTIVE

## 2015-11-28 LAB — OB RESULTS CONSOLE GC/CHLAMYDIA
Chlamydia: NEGATIVE
Gonorrhea: NEGATIVE

## 2015-11-28 LAB — TYPE AND SCREEN
ABO/RH(D): O POS
ANTIBODY SCREEN: NEGATIVE

## 2015-11-28 LAB — OB RESULTS CONSOLE ABO/RH: RH TYPE: POSITIVE

## 2015-11-28 LAB — URIC ACID: URIC ACID, SERUM: 6.4 mg/dL (ref 2.3–6.6)

## 2015-11-28 LAB — OB RESULTS CONSOLE RUBELLA ANTIBODY, IGM: Rubella: IMMUNE

## 2015-11-28 MED ORDER — TERBUTALINE SULFATE 1 MG/ML IJ SOLN
0.2500 mg | Freq: Once | INTRAMUSCULAR | Status: DC | PRN
  Filled 2015-11-28: qty 1

## 2015-11-28 MED ORDER — LACTATED RINGERS IV SOLN
500.0000 mL | INTRAVENOUS | Status: DC | PRN
  Administered 2015-11-28: 500 mL via INTRAVENOUS

## 2015-11-28 MED ORDER — EPHEDRINE 5 MG/ML INJ
10.0000 mg | INTRAVENOUS | Status: DC | PRN
  Filled 2015-11-28: qty 4

## 2015-11-28 MED ORDER — ACETAMINOPHEN 325 MG PO TABS: 650.0000 mg | ORAL_TABLET | ORAL | Status: DC | PRN

## 2015-11-28 MED ORDER — SOD CITRATE-CITRIC ACID 500-334 MG/5ML PO SOLN: 30.0000 mL | ORAL | Status: DC | PRN

## 2015-11-28 MED ORDER — PHENYLEPHRINE 40 MCG/ML (10ML) SYRINGE FOR IV PUSH (FOR BLOOD PRESSURE SUPPORT)
80.0000 ug | PREFILLED_SYRINGE | INTRAVENOUS | Status: DC | PRN
  Filled 2015-11-28: qty 5

## 2015-11-28 MED ORDER — LACTATED RINGERS IV SOLN: 500.0000 mL | Freq: Once | INTRAVENOUS | Status: DC

## 2015-11-28 MED ORDER — HYDRALAZINE HCL 20 MG/ML IJ SOLN: 10.0000 mg | Freq: Once | INTRAMUSCULAR | Status: DC | PRN

## 2015-11-28 MED ORDER — OXYTOCIN 40 UNITS IN LACTATED RINGERS INFUSION - SIMPLE MED
1.0000 m[IU]/min | INTRAVENOUS | Status: DC
  Administered 2015-11-28: 2 m[IU]/min via INTRAVENOUS
  Filled 2015-11-28: qty 1000

## 2015-11-28 MED ORDER — LIDOCAINE HCL (PF) 1 % IJ SOLN
30.0000 mL | INTRAMUSCULAR | Status: DC | PRN
  Filled 2015-11-28: qty 30

## 2015-11-28 MED ORDER — DIPHENHYDRAMINE HCL 50 MG/ML IJ SOLN: 12.5000 mg | INTRAMUSCULAR | Status: DC | PRN

## 2015-11-28 MED ORDER — ONDANSETRON HCL 4 MG/2ML IJ SOLN: 4.0000 mg | Freq: Four times a day (QID) | INTRAMUSCULAR | Status: DC | PRN

## 2015-11-28 MED ORDER — OXYCODONE-ACETAMINOPHEN 5-325 MG PO TABS: 1.0000 | ORAL_TABLET | ORAL | Status: DC | PRN

## 2015-11-28 MED ORDER — FENTANYL 2.5 MCG/ML BUPIVACAINE 1/10 % EPIDURAL INFUSION (WH - ANES)
14.0000 mL/h | INTRAMUSCULAR | Status: DC | PRN
  Administered 2015-11-28: 11 mL/h via EPIDURAL
  Filled 2015-11-28: qty 125

## 2015-11-28 MED ORDER — LABETALOL HCL 5 MG/ML IV SOLN
20.0000 mg | INTRAVENOUS | Status: DC | PRN
Start: 2015-11-28 — End: 2015-11-29

## 2015-11-28 MED ORDER — LIDOCAINE HCL (PF) 1 % IJ SOLN
INTRAMUSCULAR | Status: DC | PRN
  Administered 2015-11-28: 4 mL via EPIDURAL
  Administered 2015-11-28: 3 mL via EPIDURAL

## 2015-11-28 MED ORDER — PHENYLEPHRINE 40 MCG/ML (10ML) SYRINGE FOR IV PUSH (FOR BLOOD PRESSURE SUPPORT)
80.0000 ug | PREFILLED_SYRINGE | INTRAVENOUS | Status: DC | PRN
  Filled 2015-11-28: qty 5
  Filled 2015-11-28: qty 10

## 2015-11-28 MED ORDER — OXYTOCIN BOLUS FROM INFUSION
500.0000 mL | Freq: Once | INTRAVENOUS | Status: AC
  Administered 2015-11-28: 500 mL via INTRAVENOUS

## 2015-11-28 MED ORDER — OXYCODONE-ACETAMINOPHEN 5-325 MG PO TABS: 2.0000 | ORAL_TABLET | ORAL | Status: DC | PRN

## 2015-11-28 MED ORDER — OXYTOCIN 40 UNITS IN LACTATED RINGERS INFUSION - SIMPLE MED: 2.5000 [IU]/h | INTRAVENOUS | Status: DC

## 2015-11-28 MED ORDER — IBUPROFEN 600 MG PO TABS
600.0000 mg | ORAL_TABLET | Freq: Four times a day (QID) | ORAL | Status: DC
  Administered 2015-11-28 – 2015-11-30 (×7): 600 mg via ORAL
  Filled 2015-11-28 (×7): qty 1

## 2015-11-28 MED ORDER — LACTATED RINGERS IV SOLN: INTRAVENOUS | Status: DC

## 2015-11-28 NOTE — H&P (Signed)
Cristina Smith is Smith 30 y.o. G2P0101 at 2064w2d presenting for IOL due to gestational hypertension. Pt notes no contractions. Good fetal movement, No vaginal bleeding, not leaking fluid. No HA, no vision change, no RUQ pain.   [facility] 1[facility] - h/o PTD at 36 wks, SVD, bbay 6'2, on 17-P this pregnancy - DS 167, nl 3 hr GTT, 36# wt gain - GBS neg - Elevated bp at 35 wks with elevated uric acid and 313mg  protein/ 24 hrs. Bp's watched closely and remained nl with no si/sx or lab evidence PEC. At 37 wks repeat bp 131/91 and Pr/Cr ratio 0.6. Given risks for htn and PEC, decision for delivery. - fetal growth 36'2, 7'7, 93%, AC 99%   Prenatal Transfer Tool  Maternal Diabetes: No Genetic Screening: Normal Maternal Ultrasounds/Referrals: Normal Fetal Ultrasounds or other Referrals:  None Maternal Substance Abuse:  No Significant Maternal Medications:  None Significant Maternal Lab Results: None     OB History    Gravida Para Term Preterm AB Living   2 1   1   1    SAB TAB Ectopic Multiple Live Births           1     Past Medical History:  Diagnosis Date  . Medical history non-contributory   . SVD (spontaneous vaginal delivery) 07/25/2012   Past Surgical History:  Procedure Laterality Date  . NO PAST SURGERIES     Family History: family history includes Cancer in her father; Hypertension in her mother. Social History:  reports that she has never smoked. She has never used smokeless tobacco. She reports that she does not drink alcohol or use drugs.  Review of Systems - Negative except discomfort of pregnancy   Dilation: 3 Effacement (%): 60 Station: -2 Exam by:: foley,rn Temperature 98.4 F (36.9 C), temperature source Oral, resp. rate 18, height 5' (1.524 m), weight 91.2 kg (201 lb).  Physical Exam:  Gen: well appearing, no distress  Back: no CVAT Abd: gravid, NT, no RUQ pain, EFW 8# LE: 1+ edema, equal bilaterally, non-tender Toco: irreg FH:  baseline 140s, accelerations present, no deceleratons, 10 beat variability  Prenatal labs: ABO, Rh: --/--/O POS (10/18 1635) Antibody: PENDING (10/18 1635) Rubella: !Error!  mmuneRPR: Nonreactive (10/18 0000)  HBsAg:   neg HIV: Non-reactive (10/18 0000)  GBS:   neg 1 hr Glucola 167, nl 3 hr  Genetic screening normal NT Anatomy US normat   Assessment/Plan: 30 y.o. G2P0101 at 4164w2d IOL for PEC by bp and proteinuria. No sx, bp's mild, defer Mag. Start pitocin LGA, pt aware risks.   Cristina Smith. 11/28/2015 5:20 PM     Cristina Smith. 11/28/2015, 5:09 PM

## 2015-11-28 NOTE — Progress Notes (Signed)
S: Doing well, no complaints, pain well controlled at present, starting to have increased cramping, planning epidural  O: BP (!) 139/91   Pulse (!) 104   Temp 98.4 F (36.9 C) (Oral)   Resp 16   Ht 5' (1.524 m)   Wt 91.2 kg (201 lb)   BMI 39.26 kg/m    FHT:  FHR: 150s bpm, variability: moderate,  accelerations:  Present,  decelerations:  Absent UC:   irregular, every 3-6 minutes SVE:   Dilation: 4 Effacement (%): 60 Station: -2 Exam by:: dr Orin Eberwein AROM, light meconium  A / P:  30 y.o.  Obstetric History   G2   P1   T0   P1   A0   L1    SAB0   TAB0   Ectopic0   Multiple0   Live Births1    at 4947w2d IOL due to PEC. continue pitocin, expect more rapid change now s/p AROM, getting active. Defer Mag as no sx and bp only with mild elevation. known LGA  Fetal Wellbeing:  Category I Pain Control:  Labor support without medications  Anticipated MOD:  NSVD  Cristina Smith A. 11/28/2015, 7:19 PM

## 2015-11-28 NOTE — Anesthesia Pain Management Evaluation Note (Signed)
  CRNA Pain Management Visit Note  Patient: Cristina ParodyRachel Rottman, 30 y.o., female  "Hello I am a member of the anesthesia team at [facility]. We have an anesthesia team available at all times to provide care throughout the hospital, including epidural management and anesthesia for C-section. I don't know your plan for the delivery whether it a natural birth, water birth, IV sedation, nitrous supplementation, doula or epidural, but we want to meet your pain goals."   1.Was your pain managed to your expectations on prior hospitalizations?   Yes   2.What is your expectation for pain management during this hospitalization?     Epidural  3.How can we help you reach that goal? unsure  Record the patient's initial score and the patient's pain goal.   Pain: 0  Pain Goal: 5 [facility] wants you to be able to say your pain was always managed very well.  Cephus ShellingBURGER,Tiye Huwe 11/28/2015

## 2015-11-28 NOTE — Anesthesia Preprocedure Evaluation (Signed)
Anesthesia Evaluation  Patient identified by MRN, date of birth, ID band Patient awake    Reviewed: Allergy & Precautions, NPO status , Patient's Chart, lab work & pertinent test results  Airway Mallampati: III  TM Distance: >3 FB Neck ROM: Full    Dental no notable dental hx. (+) Teeth Intact   Pulmonary neg pulmonary ROS,    Pulmonary exam normal breath sounds clear to auscultation       Cardiovascular hypertension, Normal cardiovascular exam Rhythm:Regular Rate:Normal  PIH on no Rx   Neuro/Psych  Neuromuscular disease negative psych ROS   GI/Hepatic negative GI ROS, Neg liver ROS,   Endo/Other  Morbid obesity  Renal/GU negative Renal ROS  negative genitourinary   Musculoskeletal negative musculoskeletal ROS (+)   Abdominal (+) + obese,   Peds  Hematology negative hematology ROS (+)   Anesthesia Other Findings   Reproductive/Obstetrics (+) Pregnancy                             Lab Results  Component Value Date   WBC 9.0 11/28/2015   HGB 12.4 11/28/2015   HCT 38.0 11/28/2015   MCV 82.4 11/28/2015   PLT 266 11/28/2015     Chemistry      Component Value Date/Time   NA 130 (L) 11/28/2015 1635   K 4.2 11/28/2015 1635   CL 103 11/28/2015 1635   CO2 19 (L) 11/28/2015 1635   BUN 17 11/28/2015 1635   CREATININE 0.62 11/28/2015 1635      Component Value Date/Time   CALCIUM 8.6 (L) 11/28/2015 1635   ALKPHOS 142 (H) 11/28/2015 1635   AST 26 11/28/2015 1635   ALT 24 11/28/2015 1635   BILITOT 0.4 11/28/2015 1635      Anesthesia Physical Anesthesia Plan  ASA: III  Anesthesia Plan: Epidural   Post-op Pain Management:    Induction:   Airway Management Planned: Natural Airway  Additional Equipment:   Intra-op Plan:   Post-operative Plan:   Informed Consent: I have reviewed the patients History and Physical, chart, labs and discussed the procedure including the risks,  benefits and alternatives for the proposed anesthesia with the patient or authorized representative who has indicated his/her understanding and acceptance.     Plan Discussed with: Anesthesiologist  Anesthesia Plan Comments:         Anesthesia Quick Evaluation

## 2015-11-28 NOTE — Anesthesia Procedure Notes (Signed)
Epidural Patient location during procedure: OB Start time: 11/28/2015 8:19 PM  Staffing Anesthesiologist: Mal AmabileFOSTER, Melana Hingle Performed: anesthesiologist   Preanesthetic Checklist Completed: patient identified, site marked, surgical consent, pre-op evaluation, timeout performed, IV checked, risks and benefits discussed and monitors and equipment checked  Epidural Patient position: sitting Prep: site prepped and draped and DuraPrep Patient monitoring: continuous pulse ox and blood pressure Approach: midline Location: L3-L4 Injection technique: LOR air  Needle:  Needle type: Tuohy  Needle gauge: 17 G Needle length: 9 cm and 9 Needle insertion depth: 6 cm Catheter type: closed end flexible Catheter size: 19 Gauge Catheter at skin depth: 11 cm Test dose: negative and Other  Assessment Events: blood not aspirated, injection not painful, no injection resistance, negative IV test and no paresthesia  Additional Notes Patient identified. Risks and benefits discussed including failed block, incomplete  Pain control, post dural puncture headache, nerve damage, paralysis, blood pressure Changes, nausea, vomiting, reactions to medications-both toxic and allergic and post Partum back pain. All questions were answered. Patient expressed understanding and wished to proceed. Sterile technique was used throughout procedure. Epidural site was Dressed with sterile barrier dressing. No paresthesias, signs of intravascular injection Or signs of intrathecal spread were encountered.  Patient was more comfortable after the epidural was dosed. Please see RN's note for documentation of vital signs and FHR which are stable.

## 2015-11-29 ENCOUNTER — Encounter (HOSPITAL_COMMUNITY): Payer: Self-pay

## 2015-11-29 LAB — CBC
HEMATOCRIT: 35 % — AB (ref 36.0–46.0)
HEMATOCRIT: 39.1 % (ref 36.0–46.0)
HEMOGLOBIN: 13 g/dL (ref 12.0–15.0)
Hemoglobin: 11.9 g/dL — ABNORMAL LOW (ref 12.0–15.0)
MCH: 27.2 pg (ref 26.0–34.0)
MCH: 27.5 pg (ref 26.0–34.0)
MCHC: 33.2 g/dL (ref 30.0–36.0)
MCHC: 34 g/dL (ref 30.0–36.0)
MCV: 80.8 fL (ref 78.0–100.0)
MCV: 81.8 fL (ref 78.0–100.0)
PLATELETS: 244 10*3/uL (ref 150–400)
Platelets: 243 10*3/uL (ref 150–400)
RBC: 4.33 MIL/uL (ref 3.87–5.11)
RBC: 4.78 MIL/uL (ref 3.87–5.11)
RDW: 16 % — AB (ref 11.5–15.5)
RDW: 16.1 % — ABNORMAL HIGH (ref 11.5–15.5)
WBC: 16 10*3/uL — ABNORMAL HIGH (ref 4.0–10.5)
WBC: 16.3 10*3/uL — ABNORMAL HIGH (ref 4.0–10.5)

## 2015-11-29 LAB — RPR: RPR Ser Ql: NONREACTIVE

## 2015-11-29 MED ORDER — BENZOCAINE-MENTHOL 20-0.5 % EX AERO
1.0000 "application " | INHALATION_SPRAY | CUTANEOUS | Status: DC | PRN
  Administered 2015-11-29: 1 via TOPICAL
  Filled 2015-11-29: qty 56

## 2015-11-29 MED ORDER — COCONUT OIL OIL
1.0000 "application " | TOPICAL_OIL | Status: DC | PRN
  Administered 2015-11-29: 1 via TOPICAL
  Filled 2015-11-29: qty 120

## 2015-11-29 MED ORDER — SIMETHICONE 80 MG PO CHEW: 80.0000 mg | CHEWABLE_TABLET | ORAL | Status: DC | PRN

## 2015-11-29 MED ORDER — SENNOSIDES-DOCUSATE SODIUM 8.6-50 MG PO TABS
2.0000 | ORAL_TABLET | ORAL | Status: DC
  Administered 2015-11-29: 2 via ORAL
  Filled 2015-11-29: qty 2

## 2015-11-29 MED ORDER — OXYCODONE HCL 5 MG PO TABS: 5.0000 mg | ORAL_TABLET | ORAL | Status: DC | PRN

## 2015-11-29 MED ORDER — PRENATAL MULTIVITAMIN CH
1.0000 | ORAL_TABLET | Freq: Every day | ORAL | Status: DC
  Administered 2015-11-29 – 2015-11-30 (×2): 1 via ORAL
  Filled 2015-11-29 (×2): qty 1

## 2015-11-29 MED ORDER — ZOLPIDEM TARTRATE 5 MG PO TABS: 5.0000 mg | ORAL_TABLET | Freq: Every evening | ORAL | Status: DC | PRN

## 2015-11-29 MED ORDER — DIBUCAINE 1 % RE OINT: 1.0000 "application " | TOPICAL_OINTMENT | RECTAL | Status: DC | PRN

## 2015-11-29 MED ORDER — BISACODYL 10 MG RE SUPP: 10.0000 mg | Freq: Every day | RECTAL | Status: DC | PRN

## 2015-11-29 MED ORDER — TETANUS-DIPHTH-ACELL PERTUSSIS 5-2.5-18.5 LF-MCG/0.5 IM SUSP: 0.5000 mL | Freq: Once | INTRAMUSCULAR | Status: DC

## 2015-11-29 MED ORDER — ONDANSETRON HCL 4 MG/2ML IJ SOLN: 4.0000 mg | INTRAMUSCULAR | Status: DC | PRN

## 2015-11-29 MED ORDER — WITCH HAZEL-GLYCERIN EX PADS: 1.0000 "application " | MEDICATED_PAD | CUTANEOUS | Status: DC | PRN

## 2015-11-29 MED ORDER — ACETAMINOPHEN 325 MG PO TABS: 650.0000 mg | ORAL_TABLET | ORAL | Status: DC | PRN

## 2015-11-29 MED ORDER — ONDANSETRON HCL 4 MG PO TABS: 4.0000 mg | ORAL_TABLET | ORAL | Status: DC | PRN

## 2015-11-29 MED ORDER — DIPHENHYDRAMINE HCL 25 MG PO CAPS
25.0000 mg | ORAL_CAPSULE | Freq: Four times a day (QID) | ORAL | Status: DC | PRN
Start: 2015-11-29 — End: 2015-11-30

## 2015-11-29 MED ORDER — FLEET ENEMA 7-19 GM/118ML RE ENEM: 1.0000 | ENEMA | Freq: Every day | RECTAL | Status: DC | PRN

## 2015-11-29 MED ORDER — OXYCODONE HCL 5 MG PO TABS: 10.0000 mg | ORAL_TABLET | ORAL | Status: DC | PRN

## 2015-11-29 NOTE — Lactation Note (Signed)
This note was copied from a baby's chart. Lactation Consultation Note  Patient Name: Cristina Smith ZOXWR'UToday's Date: 11/29/2015 Reason for consult: Follow-up assessment  I provided Mom w/size 21 flanges after observing her pump for a few minutes. Size 24 flanges with colostrum were placed in a bag and given to Mom to ask NICU RN to swab for infant's oral care.   Mom was provided NICU booklet & was shown pumping guidelines, pumping log, & milk storage info. Mom knows to pump q3h for 15-20 min. Yellow stickers provided. Colostrum containers in room. Mom is on her way to visit infant in the NICU now.  Mom's 1st child is now 3yo. She says it took about 1 week for her to get any milk and that she felt that her milk never really came in. Mom reports that the most she was ever able to obtain was 50mL.   Lurline HareRichey, Rhonin Trott [facility] 11/29/2015, 7:25 PM

## 2015-11-29 NOTE — Anesthesia Postprocedure Evaluation (Signed)
Anesthesia Post Note  Patient: Cristina Smith  Procedure(s) Performed: * No procedures listed *  Patient location during evaluation: Mother Baby Anesthesia Type: Epidural Level of consciousness: awake Pain management: pain level controlled Vital Signs Assessment: post-procedure vital signs reviewed and stable Respiratory status: spontaneous breathing Postop Assessment: no headache, no backache and epidural receding Anesthetic complications: no     Last Vitals:  Vitals:   11/29/15 0000 11/29/15 0549  BP: 129/86 122/80  Pulse: (!) 109 87  Resp:  20  Temp:  36.6 C    Last Pain:  Vitals:   11/29/15 0549  TempSrc: Oral  PainSc: 2    Pain Goal:                 Edison PaceWILKERSON,Cristina Smith

## 2015-11-29 NOTE — Lactation Note (Signed)
This note was copied from a baby's chart. Lactation Consultation Note Mom's 2nd baby. BF first baby maybe a week mom stated. Mom stated baby wouldn't latch or BF well. Mom plans to try to BF while out of work and then formula feed. Nursery RN stated baby's blood sugars low. Swaddled baby d/t low blood sugar. Mom had been trying to BF but baby wouldn't latch. MD ordered formula feed d/t low bld sugar.  Formula give to baby w/stimulation w/slow flow nipple. Nipple everts well when rolled in finger tips. Discussed positioning, STS, I&O, newborn behaviors and feeding patterns. Put baby STS after bottle feeding. Once got baby to drink, baby gulped formula. Monitor for S/sx of low blood sugars.  Mom encouraged to feed baby 8-12 times/24 hours and with feeding cues. Referred to Baby and Me Book in Breastfeeding section Pg. 22-23 for position options and Proper latch demonstration. Educated about newborn behavior, STS, I&O, cluster feeding, supply and demand. WH/LC brochure given w/resources, support groups and LC services. Patient Name: Cristina Leighton ParodyRachel Smith ZOXWR'UToday's Date: 11/29/2015 Reason for consult: Initial assessment   Maternal Data Has patient been taught Hand Expression?: Yes  Feeding Feeding Type: Formula Nipple Type: Slow - flow Length of feed: 15 min (per mom)  LATCH Score/Interventions Latch: Grasps breast easily, tongue down, lips flanged, rhythmical sucking.  Audible Swallowing: None  Type of Nipple: Everted at rest and after stimulation  Comfort (Breast/Nipple): Soft / non-tender     Hold (Positioning): No assistance needed to correctly position infant at breast. Intervention(s): Support Pillows;Position options;Skin to skin  LATCH Score: 8  Lactation Tools Discussed/Used     Consult Status Consult Status: Follow-up Date: 11/29/15 Follow-up type: In-patient    Cristina DancerCARVER, Delesa Kawa Smith 11/29/2015, 6:48 AM

## 2015-11-29 NOTE — Progress Notes (Signed)
Patient ID: Cristina ParodyRachel Lewellyn, female   DOB: 1985/02/20, 30 y.o.   MRN: [id number] PPD # 1 SVD Information for the patient's newborn:  [name], Girl Fleet ContrasRachel [[id number]][[id number]]  female    breast and bottle feeding  Baby name: [name]  S:  Reports feeling well, sleepy.             Tolerating po/ No nausea or vomiting             Bleeding is light             Pain controlled with  PO meds             Up ad lib / ambulatory / voiding without difficulties        O:  A & O x 3, in no apparent distress              VS:  Vitals:   11/28/15 2345 11/29/15 0000 11/29/15 0549 11/29/15 1211  BP: 101/77 129/86 122/80 118/78  Pulse: (!) 114 (!) 109 87 99  Resp:   20 19  Temp:   97.9 F (36.6 C) 98.7 F (37.1 C)  TempSrc:   Oral Oral  SpO2:   98% 97%  Weight:      Height:        LABS:  Recent Labs  11/29/15 0013 11/29/15 0535  WBC 16.3* 16.0*  HGB 13.0 11.9*  HCT 39.1 35.0*  PLT 243 244    Blood type: --/--/O POS (10/18 1635)  Rubella: Immune (10/18 0000)   I&O: I/O last 3 completed shifts: In: -  Out: 800 [Urine:500; Blood:300]          No intake/output data recorded.  Lungs: Clear and unlabored  Heart: regular rate and rhythm / no murmurs  Abdomen: soft, non-tender, non-distended             Fundus: firm, non-tender, U-1  Perineum: no edema, repair intact  Lochia: small  Extremities: +2 pedal edema, no calf pain or tenderness    A/P: PPD # 1 30 y.o., [id number]   Principal Problem:   Postpartum care following vaginal delivery (10/18) Active Problems:   Preeclampsia - resolving  - normotensive since delivery, no neural s/s  Doing well - stable status  Routine post partum orders  Anticipate discharge tomorrow    Neta Mendsaniela C Paul, MSN, CNM 11/29/2015, 3:50 PM

## 2015-11-30 LAB — BIRTH TISSUE RECOVERY COLLECTION (PLACENTA DONATION)

## 2015-11-30 MED ORDER — IBUPROFEN 600 MG PO TABS: 600.0000 mg | ORAL_TABLET | Freq: Four times a day (QID) | ORAL | 0 refills | Status: AC

## 2015-11-30 NOTE — Progress Notes (Signed)
PPD 2 SVD with 1st degree repair  S:  Reports feeling ok - sad about leaving baby in NICU             Tolerating po/ No nausea or vomiting             Bleeding is light             Pain controlled withmotrin             Up ad lib / ambulatory / voiding QS  Newborn in NICU -  breastfeeding planned - pumping   O:               VS: BP 119/83 (BP Location: Left Arm)   Pulse 79   Temp 97.5 F (36.4 C) (Oral)   Resp 19   Ht 5' (1.524 m)   Wt 91.2 kg (201 lb)   SpO2 97%   Breastfeeding? Unknown   BMI 39.26 kg/m    LABS:              Recent Labs  11/29/15 0013 11/29/15 0535  WBC 16.3* 16.0*  HGB 13.0 11.9*  PLT 243 244               Blood type: --/--/O POS (10/18 1635)  Rubella: Immune (10/18 0000)                     Physical Exam:             Alert and oriented X3  Abdomen: soft, non-tender, non-distended              Fundus: firm, non-tender, U-1  Perineum: no edema  Lochia: light  Extremities: trace edema, no calf pain or tenderness    A: PPD # 2   Doing well - stable status  P: Routine post partum orders  DC home  Marlinda MikeBAILEY, Drena Ham CNM, MSN, [facility] 11/30/2015, 10:19 AM

## 2015-11-30 NOTE — Progress Notes (Signed)
CSW acknowledges NICU admission.    Patient screened out for psychosocial assessment since none of the following apply:  Psychosocial stressors documented in mother or baby's chart  Gestation less than 32 weeks  Code at delivery   Infant with anomalies  Please contact the Clinical Social Worker if specific needs arise, or by MOB's request.       

## 2015-11-30 NOTE — Lactation Note (Signed)
This note was copied from a baby's chart. Lactation Consultation Note  Patient Name: Cristina Smith TCNGF'R Date: 11/30/2015  Met with mom prior to possible discharge today.  Mom is pumping/hand expressing every 3 hours and obtaining drops of colostrum.  Reassured and discussed milk coming to volume.  Instructed to continue to pump/hand express 8-12 times in 24 hours.  Mom states she has a single electric pump at home.  Recommended a good quality double electric pump.  Instructed her to call her insurance company and bring pump pieces with her when visiting baby so she can use the symphony pump.  Mom eventually would like to put baby to breast but is worried about stressing her at this point.  Encouraged to call for concerns/latch assist.   Maternal Data    Feeding Feeding Type: Formula Nipple Type: Slow - flow  LATCH Score/Interventions                      Lactation Tools Discussed/Used     Consult Status      Ave Filter 11/30/2015, 11:16 AM

## 2015-11-30 NOTE — Discharge Summary (Signed)
Obstetric Discharge Summary Reason for Admission: onset of labor Prenatal Procedures: none Intrapartum Procedures: spontaneous vaginal delivery Postpartum Procedures: none Complications-Operative and Postpartum: 1st degree perineal laceration Hemoglobin  Date Value Ref Range Status  11/29/2015 11.9 (L) 12.0 - 15.0 g/dL Final   HCT  Date Value Ref Range Status  11/29/2015 35.0 (L) 36.0 - 46.0 % Final    Physical Exam:  General: alert, cooperative and no distress Lochia: appropriate Uterine Fundus: firm Incision: healing well DVT Evaluation: No evidence of DVT seen on physical exam.  Discharge Diagnoses: Term Pregnancy-delivered  Discharge Information: Date: 11/30/2015 Activity: pelvic rest Diet: routine Medications: PNV and Ibuprofen Condition: stable Instructions: refer to practice specific booklet Discharge to: home Follow-up Information    [facility]. Schedule an appointment as soon as possible for a visit in 6 week(s).   Specialty:  Obstetrics and Gynecology Contact information: [place] [place] [id number] [phone]           Newborn Data: Live born female  Birth Weight: 7 lb 8.3 oz (3410 g) APGAR: 8, 9  NICU admission  Marlinda MikeBAILEY, Dajuana Palen 11/30/2015, 10:58 AM

## 2015-12-02 ENCOUNTER — Ambulatory Visit: Payer: Self-pay

## 2015-12-02 NOTE — Lactation Note (Signed)
This note was copied from a baby's chart. Lactation Consultation Note: NICU staff nurse phone yesterday and request LC assistance needed.  Mother is an experienced breastfeeding mother. She states that she is breastfeeding and pumping. She states she feels her milk is coming. Mother is breastfeeding and then giving back any amount of ebm  that she pumps. Mother states that breast are getting firmer. She has a single electric pump at home and plans to use until she get a double electric pump. Mother feels sure that things are going to get better when her milk comes in. Advised mother to continue to give back ebm until infant is feeding well. Mother is aware of LC services ,support good. Reviewed all S/S of Mastiits and engorgement. Mother very receptive to all teaching.   Patient Name: Cristina Smith WUXLK'GToday's Date: 12/02/2015     Maternal Data    Feeding    LATCH Score/Interventions                      Lactation Tools Discussed/Used     Consult Status      Cristina Smith, Cristina Smith 12/02/2015, 10:43 AM

## 2018-07-21 DIAGNOSIS — Z20828 Contact with and (suspected) exposure to other viral communicable diseases: Secondary | ICD-10-CM | POA: Diagnosis not present

## 2018-09-28 DIAGNOSIS — H33193 Other retinoschisis and retinal cysts, bilateral: Secondary | ICD-10-CM | POA: Diagnosis not present

## 2018-12-17 DIAGNOSIS — Z23 Encounter for immunization: Secondary | ICD-10-CM | POA: Diagnosis not present

## 2018-12-17 DIAGNOSIS — Z111 Encounter for screening for respiratory tuberculosis: Secondary | ICD-10-CM | POA: Diagnosis not present

## 2018-12-19 DIAGNOSIS — Z111 Encounter for screening for respiratory tuberculosis: Secondary | ICD-10-CM | POA: Diagnosis not present

## 2019-03-28 DIAGNOSIS — Z0000 Encounter for general adult medical examination without abnormal findings: Secondary | ICD-10-CM | POA: Diagnosis not present

## 2019-03-28 DIAGNOSIS — Z1151 Encounter for screening for human papillomavirus (HPV): Secondary | ICD-10-CM | POA: Diagnosis not present

## 2019-03-28 DIAGNOSIS — Z13220 Encounter for screening for lipoid disorders: Secondary | ICD-10-CM | POA: Diagnosis not present

## 2019-03-28 DIAGNOSIS — Z3046 Encounter for surveillance of implantable subdermal contraceptive: Secondary | ICD-10-CM | POA: Diagnosis not present

## 2019-03-28 DIAGNOSIS — Z130 Encounter for screening for diseases of the blood and blood-forming organs and certain disorders involving the immune mechanism: Secondary | ICD-10-CM | POA: Diagnosis not present

## 2019-03-28 DIAGNOSIS — Z01419 Encounter for gynecological examination (general) (routine) without abnormal findings: Secondary | ICD-10-CM | POA: Diagnosis not present

## 2019-03-28 DIAGNOSIS — Z6836 Body mass index (BMI) 36.0-36.9, adult: Secondary | ICD-10-CM | POA: Diagnosis not present

## 2019-03-28 DIAGNOSIS — Z1329 Encounter for screening for other suspected endocrine disorder: Secondary | ICD-10-CM | POA: Diagnosis not present

## 2019-04-11 DIAGNOSIS — Z20828 Contact with and (suspected) exposure to other viral communicable diseases: Secondary | ICD-10-CM | POA: Diagnosis not present

## 2019-04-20 DIAGNOSIS — Z1383 Encounter for screening for respiratory disorder NEC: Secondary | ICD-10-CM | POA: Diagnosis not present

## 2019-04-20 DIAGNOSIS — Z20828 Contact with and (suspected) exposure to other viral communicable diseases: Secondary | ICD-10-CM | POA: Diagnosis not present

## 2019-04-25 DIAGNOSIS — Z20828 Contact with and (suspected) exposure to other viral communicable diseases: Secondary | ICD-10-CM | POA: Diagnosis not present

## 2019-05-02 DIAGNOSIS — Z20828 Contact with and (suspected) exposure to other viral communicable diseases: Secondary | ICD-10-CM | POA: Diagnosis not present

## 2019-05-09 DIAGNOSIS — Z1383 Encounter for screening for respiratory disorder NEC: Secondary | ICD-10-CM | POA: Diagnosis not present

## 2019-05-09 DIAGNOSIS — Z20828 Contact with and (suspected) exposure to other viral communicable diseases: Secondary | ICD-10-CM | POA: Diagnosis not present
